# Patient Record
Sex: Male | Born: 1965 | Race: Black or African American | Hispanic: No | Marital: Single | Smoking: Current some day smoker
Health system: Southern US, Community
[De-identification: ages and names within clinical notes are randomized; demographics above are authoritative.]

## PROBLEM LIST (undated history)

## (undated) DIAGNOSIS — I739 Peripheral vascular disease, unspecified: Secondary | ICD-10-CM

## (undated) DIAGNOSIS — I10 Essential (primary) hypertension: Secondary | ICD-10-CM

## (undated) DIAGNOSIS — E781 Pure hyperglyceridemia: Secondary | ICD-10-CM

## (undated) DIAGNOSIS — B182 Chronic viral hepatitis C: Secondary | ICD-10-CM

## (undated) DIAGNOSIS — E782 Mixed hyperlipidemia: Secondary | ICD-10-CM

## (undated) DIAGNOSIS — Z7902 Long term (current) use of antithrombotics/antiplatelets: Secondary | ICD-10-CM

## (undated) DIAGNOSIS — M797 Fibromyalgia: Secondary | ICD-10-CM

## (undated) DIAGNOSIS — K219 Gastro-esophageal reflux disease without esophagitis: Secondary | ICD-10-CM

## (undated) DIAGNOSIS — R079 Chest pain, unspecified: Secondary | ICD-10-CM

## (undated) HISTORY — PX: OTHER SURGICAL HISTORY: SHX169

---

## 2013-07-28 ENCOUNTER — Emergency Department: Payer: Self-pay | Admitting: Emergency Medicine

## 2013-07-28 LAB — BASIC METABOLIC PANEL
Anion Gap: 8 (ref 7–16)
BUN: 14 mg/dL (ref 7–18)
Chloride: 104 mmol/L (ref 98–107)
Co2: 25 mmol/L (ref 21–32)
Creatinine: 1.15 mg/dL (ref 0.60–1.30)
EGFR (African American): >60
EGFR (Non-African Amer.): >60
Glucose: 121 mg/dL — ABNORMAL HIGH (ref 65–99)
Sodium: 137 mmol/L (ref 136–145)

## 2013-07-28 LAB — URINALYSIS, COMPLETE
Bilirubin,UR: NEGATIVE
Blood: NEGATIVE
Glucose,UR: NEGATIVE mg/dL (ref 0–75)
Leukocyte Esterase: NEGATIVE
Ph: 5 (ref 4.5–8.0)
Protein: NEGATIVE
RBC,UR: 3 /HPF (ref 0–5)
Specific Gravity: 1.029 (ref 1.003–1.030)

## 2013-07-28 LAB — CBC
HCT: 43.5 % (ref 40.0–52.0)
HGB: 15.1 g/dL (ref 13.0–18.0)
MCHC: 34.8 g/dL (ref 32.0–36.0)
Platelet: 220 10*3/uL (ref 150–440)
WBC: 8.1 10*3/uL (ref 3.8–10.6)

## 2013-07-28 LAB — TROPONIN I: Troponin-I: 0.03 ng/mL

## 2013-08-01 ENCOUNTER — Emergency Department: Payer: Self-pay | Admitting: Emergency Medicine

## 2013-08-01 LAB — URINALYSIS, COMPLETE
Bilirubin,UR: NEGATIVE
Blood: NEGATIVE
Ketone: NEGATIVE
Ph: 5 (ref 4.5–8.0)
RBC,UR: <1 /HPF (ref 0–5)

## 2013-08-01 LAB — CBC
HCT: 45.6 % (ref 40.0–52.0)
MCHC: 33.6 g/dL (ref 32.0–36.0)
MCV: 88 fL (ref 80–100)
RBC: 5.19 10*6/uL (ref 4.40–5.90)
RDW: 13.6 % (ref 11.5–14.5)

## 2013-08-01 LAB — COMPREHENSIVE METABOLIC PANEL
Alkaline Phosphatase: 86 U/L (ref 50–136)
Anion Gap: 2 — ABNORMAL LOW (ref 7–16)
BUN: 12 mg/dL (ref 7–18)
Bilirubin,Total: 0.9 mg/dL (ref 0.2–1.0)
Calcium, Total: 9.6 mg/dL (ref 8.5–10.1)
Co2: 29 mmol/L (ref 21–32)
Creatinine: 1.03 mg/dL (ref 0.60–1.30)
EGFR (Non-African Amer.): >60
Glucose: 95 mg/dL (ref 65–99)
Osmolality: 272 (ref 275–301)
SGOT(AST): 54 U/L — ABNORMAL HIGH (ref 15–37)
SGPT (ALT): 65 U/L (ref 12–78)
Sodium: 136 mmol/L (ref 136–145)
Total Protein: 8.8 g/dL — ABNORMAL HIGH (ref 6.4–8.2)

## 2013-10-03 DIAGNOSIS — R079 Chest pain, unspecified: Secondary | ICD-10-CM | POA: Insufficient documentation

## 2014-05-31 ENCOUNTER — Emergency Department: Payer: Self-pay | Admitting: Emergency Medicine

## 2014-05-31 LAB — CBC WITH DIFFERENTIAL/PLATELET
Basophil #: 0 10*3/uL (ref 0.0–0.1)
Basophil %: 0.4 %
EOS PCT: 3.5 %
Eosinophil #: 0.2 10*3/uL (ref 0.0–0.7)
HCT: 45.2 % (ref 40.0–52.0)
HGB: 14.6 g/dL (ref 13.0–18.0)
Lymphocyte #: 1.5 10*3/uL (ref 1.0–3.6)
Lymphocyte %: 31.1 %
MCH: 29.2 pg (ref 26.0–34.0)
MCHC: 32.3 g/dL (ref 32.0–36.0)
MCV: 90 fL (ref 80–100)
Monocyte #: 1 x10 3/mm (ref 0.2–1.0)
Monocyte %: 20.8 %
NEUTROS PCT: 44.2 %
Neutrophil #: 2.1 10*3/uL (ref 1.4–6.5)
PLATELETS: 178 10*3/uL (ref 150–440)
RBC: 5 10*6/uL (ref 4.40–5.90)
RDW: 12.9 % (ref 11.5–14.5)
WBC: 4.7 10*3/uL (ref 3.8–10.6)

## 2014-06-17 ENCOUNTER — Ambulatory Visit: Payer: Self-pay | Admitting: Vascular Surgery

## 2014-06-17 LAB — CREATININE, SERUM
CREATININE: 1.01 mg/dL (ref 0.60–1.30)
EGFR (Non-African Amer.): >60

## 2014-06-17 LAB — BUN: BUN: 14 mg/dL (ref 7–18)

## 2014-07-24 ENCOUNTER — Ambulatory Visit: Payer: Self-pay | Admitting: Gastroenterology

## 2014-07-26 LAB — PATHOLOGY REPORT

## 2014-09-10 DIAGNOSIS — G894 Chronic pain syndrome: Secondary | ICD-10-CM | POA: Insufficient documentation

## 2014-09-10 DIAGNOSIS — Z0289 Encounter for other administrative examinations: Secondary | ICD-10-CM | POA: Insufficient documentation

## 2014-09-22 DIAGNOSIS — B182 Chronic viral hepatitis C: Secondary | ICD-10-CM | POA: Insufficient documentation

## 2014-09-23 ENCOUNTER — Ambulatory Visit: Payer: Self-pay | Admitting: Gastroenterology

## 2014-11-18 ENCOUNTER — Ambulatory Visit: Payer: Self-pay | Admitting: Vascular Surgery

## 2014-11-18 LAB — BASIC METABOLIC PANEL
Anion Gap: 7 (ref 7–16)
BUN: 21 mg/dL — ABNORMAL HIGH (ref 7–18)
CHLORIDE: 106 mmol/L (ref 98–107)
CO2: 26 mmol/L (ref 21–32)
Calcium, Total: 8.5 mg/dL (ref 8.5–10.1)
Creatinine: 2.01 mg/dL — ABNORMAL HIGH (ref 0.60–1.30)
EGFR (African American): 46 — ABNORMAL LOW
EGFR (Non-African Amer.): 38 — ABNORMAL LOW
Glucose: 121 mg/dL — ABNORMAL HIGH (ref 65–99)
Osmolality: 282 (ref 275–301)
Potassium: 4.3 mmol/L (ref 3.5–5.1)
SODIUM: 139 mmol/L (ref 136–145)

## 2015-02-14 NOTE — Op Note (Signed)
PATIENT NAME:  Ricky King, Ricky King MR#:  [id number] DATE OF BIRTH:  03/21/1966  DATE OF PROCEDURE:  06/17/2014  PREOPERATIVE DIAGNOSES:  Atherosclerotic occlusive disease, bilateral lower extremities, with rest pain, bilateral lower extremities.   POSTOPERATIVE DIAGNOSES:   1.  Atherosclerotic occlusive disease, bilateral lower extremities, with rest pain, bilateral lower extremities.  2.  Additional complication of vascular device with in stent restenosis.   3.  Hypertension.  4.  Hypercholesterolemia.   PROCEDURES PERFORMED: 1.  Abdominal aortogram.  2.  Right lower extremity distal runoff.  3.  Percutaneous transluminal angioplasty, and stent placement, right common iliac artery to 8 mm.  4.  Percutaneous transluminal angioplasty, and stent placement, left common iliac artery to 8 mm.  5.  Percutaneous transluminal angioplasty, and stent placement, right external iliac artery to 8 mm.  6.  Percutaneous transluminal angioplasty, and stent placement, left external iliac artery to 7 mm.   PROCEDURE PERFORMED:  Ricky Cabal, MD   SEDATION: Versed 5 mg plus fentanyl 200 mcg administered IV. Continuous ECG, pulse oximetry, and cardiopulmonary monitoring was performed throughout the entire procedure by the interventional radiology nurse. Total sedation time was 1 hour 40 minutes.   ACCESS:  1.  A 7 French sheath, right common femoral artery, retrograde direction.  2.  A 7 French sheath, left common femoral artery, retrograde direction.   CONTRAST USED: Isovue 90 mL.   FLUOROSCOPY TIME:  Was 7.6 minutes.   INDICATIONS: Ricky King is a 49 year old gentleman who is complaining of severe pain in both lower extremities. Physical examination as well as noninvasive studies as well as previous interventions, and surgeries confirm severe atherosclerotic occlusive disease. He is therefore undergoing angiography with the hope for intervention for limb salvage. The risks, and benefits have been  reviewed. All questions are answered. The patient has agreed to proceed.   DESCRIPTION OF PROCEDURE: The patient is taken to special procedures, and placed in the supine position. After adequate sedation is achieved, both groins are prepped, and draped in sterile fashion; 1% lidocaine is infiltrated in the soft tissues on the left. Significant scar tissue was noted on the left, status previous femoral to distal bypass surgery; however, with ultrasound guidance given these adverse circumstances, the common femoral artery is identified, it is echolucent, and pulsatile indicating patency. Image is recorded for the permanent record, and after 1% lidocaine is infiltrated in the soft tissues a micropuncture needle is inserted into the anterior wall of microwire followed by micro sheath, J-wire followed by a 5 French sheath. Using a pigtail catheter, and the J-wire, the catheter wire combination is negotiated into the aorta. Pigtail catheter is positioned at the level of T12, and AP projection of the aorta is obtained.   The pigtail catheter is then repositioned to above the bifurcations which show previous stent placement. Interestingly, previous stents have raised the aortic bifurcation by approximately 2.5 to 3 cm. The oblique views of the pelvis both Ricky King, and Ricky King are then obtained. This demonstrates significant disease within both stents, as well as within the proximal external iliac on the right and the proximal,  and mid external iliac on the left.   Ultrasound is then returned to the field. The right common femoral artery is identified. It is echolucent, and pulsatile indicating patency. Image is recorded for the permanent record; 1% lidocaine is infiltrated in the soft tissues, and access to the common femoral artery is obtained with a micropuncture needle, microwire followed by micro sheath, J-wire is then  inserted. J-wire and then a 6 French sheath is inserted. A Rosen wire is then negotiated through the  stents on the right, and a [name] wire is then negotiated through the 5 [place] sheath, and through the stents on the left. A 5 French sheath on the left is upsized to a 6 [place] sheath; subsequently, after magnified views are obtained by hand injection, it is decided to use atrium stents, given the in-stent restenosis in the previously placed stents, and therefore both sheaths subsequently upsized over the [name] wires to 7 [place]. As noted, 4000 units of heparin has been given.   An 8 x 58 atrium stent is advanced up the right, and a 8 x 38, up the left, their positions at the leading edges matched to the previously placed stents, and inflated simultaneously to 14 atmospheres for 1 minute.   Hand injection is then used up the right side and after review of these images, an 8 x 60, LifeStar stent is advanced overlapping the new atrium stent by approximately 8 to 10 mm, and deployed, and subsequently an 8 x 60 Rival balloon is used to post dilate this. This stent extends approximately 2 cm into the external iliac treating the ostial lesion of the external iliac as its intended purpose.   Attention is then returned to the left. Hand injection demonstrates significant stenosis within the external, and distal common; a second 8 x 38 atrium stent is then deployed completing the treatment of the common iliac, and then an 8 x 80 LifeStar stent is deployed overlapping the atrium stent by 8 to 10 mm, and extending it into the mid external iliac on the left, this stent is then post dilated with a 7 x 60 balloon.   The pigtail catheter is then advanced up the right, and AP projection of the aorta iliac system is obtained. Excellent flow is now noted. No significant residual stenosis is noted on either the right or the left, and subsequently hand injection of contrast through the right-sided sheath is used to complete distal runoff of the right leg.   Oblique view of the right groin is obtained, and a StarClose device  deployed without difficulty.   The 7 French sheath is then downsized to a 6 [place] sheath, and subsequently an oblique view is obtained, and a Mynx device is deployed on the left without difficulty. There are no immediate complications.   INTERPRETATION: The abdominal aorta is opacified with a bolus injection of contrast. It is significant for mild atherosclerotic changes to moderate atherosclerotic changes, but no hemodynamically significant stenosis, bilateral nephrograms noted, single renal arteries are noted; moderate renal artery stenosis is identified bilaterally.   At the aortic bifurcation, there is previously placed stents, these extend into the aorta by 2.5 to 3 cm, raising the bifurcation quite a bit, and clearly making it impossible in the future to go up and over. There is also profound subtotal occlusive disease noted in both stents, and this disease in a diffuse nature extends down into the right external, and left external iliac arteries bilaterally. On the left the internal iliac artery is occluded throughout its course on the right, it is a rather large artery.   Later images will demonstrate that the right common femoral, profunda femoris is patent with diffuse disease but no hemodynamically significant lesions. The SFA is patent in its proximal portion for a short segment and then occludes, it reconstitutes at [place]'s canal, and fills a patent popliteal; there is patency of  the trifurcation; however, the posterior tibial appears to be the only tibial which is intact down to the ankle.   On the left as noted above, there is diffuse disease throughout the common and external, and subtotal occlusion throughout the common in several locations.   Following angioplasty and stenting, essentially following the previously placed stents as the guide, using Atrium covered stents, there is excellent result within the commons, using LifeStar stents  in the external portions with excellent result  as well.   SUMMARY: Successful recanalization of the aorta iliac system. Should the patient's symptoms improve sufficiently, and he returns to non-lifestyle limiting claudication then no further interventions; however, as was discussed with him postoperatively, should he continue to have lifestyle limitations, then further treatment of his right superficial femoral artery occlusive lesion can be undertaken, but this will be quite difficult, given the pattern of previously placed stents.    ____________________________ Ricky Cabal, MD ggs:nt D: 06/17/2014 13:43:48 ET T: 06/17/2014 20:40:51 ET JOB#: [id number]  cc: Ricky Cabal, MD, <Dictator> Elyse Jarvis, MD Ricky Cabal MD ELECTRONICALLY SIGNED 07/01/2014 22:38

## 2015-02-22 NOTE — Op Note (Signed)
PATIENT NAME:  Ricky King, Ricky King MR#:  [id number] DATE OF BIRTH:  [birth year]  DATE OF PROCEDURE:  11/18/2014  PREOPERATIVE DIAGNOSIS: Atherosclerotic occlusive disease, bilateral lower extremities, with rest pain of the right lower extremity.  POSTOPERATIVE DIAGNOSIS: Atherosclerotic occlusive disease, bilateral lower extremities, with rest pain of the right lower extremity.  PROCEDURES PERFORMED: 1.  Angiography. right lower extremity, third order catheter placement.  2.  Crosser atherectomy, right superficial femoral artery.  3.  Percutaneous transluminal angioplasty and stent placement to 6 mm, right superficial femoral artery.   SURGEON: [name], MD   SEDATION: Versed plus fentanyl.   CONTRAST USED: Isovue 70 mL.   FLUOROSCOPY TIME: 12.5 minutes.   INDICATIONS: Mr. Battaglini is a 49 year old gentleman who continues to complain of leg pain. He has not gotten any relief from pain management and has decided to proceed with intervention of the right lower extremity. In the past, he has undergone successful angioplasty and stent placement within the aorta iliac system. He has, therefore, elected to move forward with the right lower extremity revascularization. The risks and benefits are reviewed. All questions answered. The patient has agreed to proceed.   DESCRIPTION OF PROCEDURE: The patient is taken to special procedures and placed in the supine position. After adequate sedation has been achieved, he is positioned supine. The right common femoral artery is approached in an antegrade fashion. Ultrasound is placed in a sterile sleeve. The common femoral artery is identified. It is echolucent and pulsatile indicating patency. Image is recorded for the permanent record, and after multiple attempts with several different wires, access is attained. Microwire is advanced under fluoroscopy and actually advances into the SFA de novo. Micro sheath is then placed with its tip in the SFA, J-wire  followed by a 5 [place] sheath.   Hand injection of contrast is then placed through the 5 French sheath, demonstrating that the SFA essentially is a string sign. The sheath is occlusive at the origin. I believe the origin was essentially occluded as well and that the wire and catheter just advanced into it de novo as mentioned above. There is total occlusion in the mid one-third of the SFA. There appears to be reconstitution of the above-knee popliteal. Popliteal in its midportion appears to have some disease and distally the trifurcation is patent and there appears to be 3-vessel runoff to the ankle.   Crosser atherectomy catheter, the S6 device is prepped, a 0.018 wire is advanced through the 5 [place] sheath down to the occlusion and subsequently a straight Usher catheter, S6 and Usher catheter are used to cross the occlusion. Initial image demonstrates the subintimal position; however, on pulling back the Usher and re-imaging there does appear to be a pathway into the native true lumen, which is subsequently negotiated using an angled 0.018 gold-tipped glide catheter. The Usher catheter is then advanced down into the tibioperoneal trunk where hand injection of contrast is used to demonstrate the tibial vessel runoff completely down to the ankle which does then confirm patency. Initially, a 3 x 30 balloon was advanced over the 0.018 wire to pre-dilate this area. Subsequently, a 4 French straight glide catheter is used to exchange the V-18 for a 260 Magic torque, and then using 4 mm Lutonix balloons, serial angioplasty is performed from [place] canal all the way up through the origin. Each inflation is for 3 minutes to 14 atmospheres. Followup imaging demonstrates that there are still multiple areas that are significantly undersized and remain flow limiting and  therefore, a 5 mm balloon angioplasty is performed. This is a standard balloon. Followup imaging demonstrates that in the midportion there continues to be  a greater than 60% residual stenosis, and so a 6 x 120 LifeStent is deployed across this area; it is post dilated with a 6 mm balloon. Followup imaging now demonstrates this area is resolved; however, the sheath appears to be occlusive secondary to high-grade residual stenosis in the origin and a 6 x 10 Lutonix balloon is advanced across the origin of the SFA extending approximately 2 cm into the common femoral. It is used to angioplasty the proximal portion, and followup imaging demonstrates that there is now rapid flow of contrast, wide patency of the origin of the SFA, no evidence of dissection, stent is now widely with patent and well approximated. In the mid popliteal there appears to be a greater than 70% stenosis and this is treated with a 4 mm balloon angioplasty using one of the previously inflated Lutonix. Inflation is to 8 atmospheres for 1 minute and followup  imaging from [facility] canal down distally demonstrates wide patency of the popliteal, the trifurcation, and tibial vessels.   Wire is then negotiated into the profunda. The wire in the SFA is removed. Dilator is advanced over the wire through the sheath and the sheath is advanced into the profunda. A Mynx device is then deployed with excellent result. There are no immediate complications. The patient is taken to recovery in stable condition.   INTERPRETATION: Initial views demonstrate the common femoral is patent as is the profunda. There is poor collateral filling between the profunda and the SFA. The SFA is a string sign, essentially occluded at its origin, and true absence of any evidence of the SFA in its mid one-third as noted. The distal SFA above-knee popliteal is reconstituted and there appears to be patency down to the foot as described above.   Following crosser atherectomy, the true lumen is engaged and this is verified with contrast injection. Following balloon angioplasty, there is high-grade residual stenosis in the midportion.  This is resolved with stent placement at the origin. This stenosis is resolved with angioplasty alone, but it was taken to 6 mm in diameter using a Lutonix balloon.   SUMMARY: Successful recanalization of the right SFA from the right groin, antegrade approach, as described above.    ____________________________ [name], MD ggs:TT D: 11/18/2014 11:45:19 ET T: 11/18/2014 14:20:26 ET JOB#: [id number]  cc: [name], MD, <Dictator> [name] MD ELECTRONICALLY SIGNED 11/26/2014 17:43

## 2015-05-24 DIAGNOSIS — G5790 Unspecified mononeuropathy of unspecified lower limb: Secondary | ICD-10-CM | POA: Insufficient documentation

## 2015-08-13 ENCOUNTER — Ambulatory Visit: Payer: Medicaid Other | Attending: Family Medicine

## 2015-08-18 ENCOUNTER — Ambulatory Visit: Payer: Medicaid Other

## 2015-09-16 ENCOUNTER — Encounter: Payer: Self-pay | Admitting: *Deleted

## 2015-09-21 ENCOUNTER — Ambulatory Visit: Payer: Medicaid Other | Admitting: Registered Nurse

## 2015-09-21 ENCOUNTER — Encounter: Payer: Self-pay | Admitting: *Deleted

## 2015-09-21 ENCOUNTER — Encounter
Admission: RE | Disposition: A | Discharge status: Home / Self Care | Payer: Self-pay | Source: Ambulatory Visit | Attending: Gastroenterology

## 2015-09-21 ENCOUNTER — Ambulatory Visit
Admission: RE | Admit: 2015-09-21 | Discharge: 2015-09-21 | Disposition: A | Discharge status: Home / Self Care | Payer: Medicaid Other | Source: Ambulatory Visit | Attending: Gastroenterology | Admitting: Gastroenterology

## 2015-09-21 DIAGNOSIS — E782 Mixed hyperlipidemia: Secondary | ICD-10-CM | POA: Diagnosis not present

## 2015-09-21 DIAGNOSIS — E781 Pure hyperglyceridemia: Secondary | ICD-10-CM | POA: Diagnosis not present

## 2015-09-21 DIAGNOSIS — Z79899 Other long term (current) drug therapy: Secondary | ICD-10-CM | POA: Insufficient documentation

## 2015-09-21 DIAGNOSIS — I739 Peripheral vascular disease, unspecified: Secondary | ICD-10-CM | POA: Insufficient documentation

## 2015-09-21 DIAGNOSIS — R079 Chest pain, unspecified: Secondary | ICD-10-CM | POA: Diagnosis not present

## 2015-09-21 DIAGNOSIS — I10 Essential (primary) hypertension: Secondary | ICD-10-CM | POA: Insufficient documentation

## 2015-09-21 DIAGNOSIS — Z7982 Long term (current) use of aspirin: Secondary | ICD-10-CM | POA: Insufficient documentation

## 2015-09-21 DIAGNOSIS — R1310 Dysphagia, unspecified: Secondary | ICD-10-CM | POA: Diagnosis present

## 2015-09-21 DIAGNOSIS — K921 Melena: Secondary | ICD-10-CM | POA: Diagnosis not present

## 2015-09-21 DIAGNOSIS — K219 Gastro-esophageal reflux disease without esophagitis: Secondary | ICD-10-CM | POA: Diagnosis not present

## 2015-09-21 DIAGNOSIS — K738 Other chronic hepatitis, not elsewhere classified: Secondary | ICD-10-CM | POA: Diagnosis not present

## 2015-09-21 DIAGNOSIS — Z87891 Personal history of nicotine dependence: Secondary | ICD-10-CM | POA: Insufficient documentation

## 2015-09-21 DIAGNOSIS — K31819 Angiodysplasia of stomach and duodenum without bleeding: Secondary | ICD-10-CM | POA: Diagnosis not present

## 2015-09-21 DIAGNOSIS — M797 Fibromyalgia: Secondary | ICD-10-CM | POA: Diagnosis not present

## 2015-09-21 HISTORY — DX: Gastro-esophageal reflux disease without esophagitis: K21.9

## 2015-09-21 HISTORY — DX: Long term (current) use of antithrombotics/antiplatelets: Z79.02

## 2015-09-21 HISTORY — DX: Fibromyalgia: M79.7

## 2015-09-21 HISTORY — PX: ESOPHAGOGASTRODUODENOSCOPY (EGD) WITH PROPOFOL: SHX5813

## 2015-09-21 HISTORY — DX: Chronic viral hepatitis C: B18.2

## 2015-09-21 HISTORY — DX: Mixed hyperlipidemia: E78.2

## 2015-09-21 HISTORY — DX: Peripheral vascular disease, unspecified: I73.9

## 2015-09-21 HISTORY — DX: Essential (primary) hypertension: I10

## 2015-09-21 HISTORY — DX: Chest pain, unspecified: R07.9

## 2015-09-21 HISTORY — DX: Pure hyperglyceridemia: E78.1

## 2015-09-21 SURGERY — ESOPHAGOGASTRODUODENOSCOPY (EGD) WITH PROPOFOL
Anesthesia: General

## 2015-09-21 MED ORDER — GLYCOPYRROLATE 0.2 MG/ML IJ SOLN
INTRAMUSCULAR | Status: DC | PRN
  Administered 2015-09-21: 0.2 mg via INTRAVENOUS

## 2015-09-21 MED ORDER — PROPOFOL 10 MG/ML IV BOLUS
INTRAVENOUS | Status: DC | PRN
  Administered 2015-09-21 (×2): 20 mg via INTRAVENOUS
  Administered 2015-09-21: 50 mg via INTRAVENOUS

## 2015-09-21 MED ORDER — SODIUM CHLORIDE 0.9 % IV SOLN
INTRAVENOUS | Status: DC
  Administered 2015-09-21: 10:00:00 via INTRAVENOUS

## 2015-09-21 MED ORDER — MIDAZOLAM HCL 2 MG/2ML IJ SOLN
INTRAMUSCULAR | Status: DC | PRN
  Administered 2015-09-21: 1 mg via INTRAVENOUS

## 2015-09-21 MED ORDER — FENTANYL CITRATE (PF) 100 MCG/2ML IJ SOLN
INTRAMUSCULAR | Status: DC | PRN
  Administered 2015-09-21: 50 ug via INTRAVENOUS

## 2015-09-21 MED ORDER — LIDOCAINE HCL (CARDIAC) 20 MG/ML IV SOLN
INTRAVENOUS | Status: DC | PRN
  Administered 2015-09-21: 40 mg via INTRAVENOUS

## 2015-09-21 MED ORDER — PROPOFOL 500 MG/50ML IV EMUL
INTRAVENOUS | Status: DC | PRN
  Administered 2015-09-21: 200 ug/kg/min via INTRAVENOUS

## 2015-09-21 NOTE — Op Note (Signed)
[facility] Patient Name: Ricky King Procedure Date: 09/21/2015 10:50 AM MRN: [id number] Account #: [id number] Date of Birth: 1966-10-19 Admit Type: Outpatient Age: 49 Room: [facility] ENDO ROOM 2 Gender: Male Note Status: Finalized Procedure:         Upper GI endoscopy Indications:       Dysphagia, Melena Patient Profile:   This is a 49 year old male. Providers:         Gerrit Heck. Rayann Heman, MD Referring MD:      Dyke Maes. Mancheno Revelo (Referring MD) Medicines:         Propofol per Anesthesia Complications:     No immediate complications. Procedure:         Pre-Anesthesia Assessment:                    - Prior to the procedure, a History and Physical was                     performed, and patient medications, allergies and                     sensitivities were reviewed. The patient's tolerance of                     previous anesthesia was reviewed.                    After obtaining informed consent, the endoscope was passed                     under direct vision. Throughout the procedure, the                     patient's blood pressure, pulse, and oxygen saturations                     were monitored continuously. The Olympus GIF-160 endoscope                     (S#. [id number]) was introduced through the mouth, and                     advanced to the second part of duodenum. The upper GI                     endoscopy was accomplished without difficulty. The patient                     tolerated the procedure well. Findings:      The esophagus was normal.      A single small no bleeding angioectasia was found in the gastric body.       Fulguration to ablate the lesion by argon plasma at 0.8 liters/minute       and 30 watts was successful.      The examined duodenum was normal.      Biopsies were taken with a cold forceps in the lower third of the       esophagus for histology.      Biopsies were taken with a cold forceps in the upper third of  the       esophagus for histology. Impression:        - Normal esophagus.                    -  A single non-bleeding angioectasia in the stomach.                     Treated with argon plasma coagulation (APC).                    - Normal examined duodenum.                    - Biopsies were taken with a cold forceps for histology in                     the lower third of the esophagus.                    - Biopsies were taken with a cold forceps for histology in                     the upper third of the esophagus. Recommendation:    - Observe patient in GI recovery unit.                    - Resume regular diet.                    - Continue present medications.                    - Await pathology results.                    - Return to GI office.                    - Consider esophageal manometry if trouble swallowing                     continues and biopsies are normal                    - Perform colonoscopy if black stools continue                    - Ok to restart plavix today.                    - The findings and recommendations were discussed with the                     patient.                    - The findings and recommendations were discussed with the                     patient's family. Procedure Code(s): --- Professional ---                    [phone], Esophagogastroduodenoscopy, flexible, transoral;                     with ablation of tumor(s), polyp(s), or other lesion(s)                     (includes pre- and post-dilation and guide wire passage,                     when performed)                    [id number], Esophagogastroduodenoscopy,  flexible, transoral;                     with biopsy, single or multiple Diagnosis Code(s): --- Professional ---                    K31.819, Angiodysplasia of stomach and duodenum without                     bleeding                    R13.10, Dysphagia, unspecified                    K92.1, Melena CPT copyright 2014 American  Medical Association. All rights reserved. The codes documented in this report are preliminary and upon coder review may  be revised to meet current compliance requirements. Mellody Life, MD 09/21/2015 11:14:22 AM This report has been signed electronically. Number of Addenda: 0 Note Initiated On: 09/21/2015 10:50 AM      [facility]

## 2015-09-21 NOTE — Transfer of Care (Signed)
Immediate Anesthesia Transfer of Care Note  Patient: Ricky King  Procedure(s) Performed: Procedure(s): ESOPHAGOGASTRODUODENOSCOPY (EGD) WITH PROPOFOL (N/A)  Patient Location: PACU and Endoscopy Unit  Anesthesia Type:General  Level of Consciousness: sedated  Airway & Oxygen Therapy: Patient Spontanous Breathing and Patient connected to nasal cannula oxygen  Post-op Assessment: Report given to RN and Post -op Vital signs reviewed and stable  Post vital signs: Reviewed and stable  Last Vitals:  Filed Vitals:   09/21/15 0944 09/21/15 1116  BP: 132/83 100/56  Pulse: 79 92  Temp: 36.4 C 36.6 C  Resp: 12 17    Complications: No apparent anesthesia complications

## 2015-09-21 NOTE — Anesthesia Postprocedure Evaluation (Signed)
Anesthesia Post Note  Patient: Ricky King  Procedure(s) Performed: Procedure(s) (LRB): ESOPHAGOGASTRODUODENOSCOPY (EGD) WITH PROPOFOL (N/A)  Patient location during evaluation: PACU Anesthesia Type: General Level of consciousness: awake Pain management: pain level controlled Vital Signs Assessment: post-procedure vital signs reviewed and stable Respiratory status: nonlabored ventilation Cardiovascular status: stable Anesthetic complications: no    Last Vitals:  Filed Vitals:   09/21/15 0944 09/21/15 1116  BP: 132/83 100/56  Pulse: 79 92  Temp: 36.4 C 36.6 C  Resp: 12 17    Last Pain:  Filed Vitals:   09/21/15 1117  PainSc: 8                  VAN STAVEREN,Orrie Lascano

## 2015-09-21 NOTE — Discharge Instructions (Signed)
OK to restart plavix today.   YOU HAD AN ENDOSCOPIC PROCEDURE TODAY: Refer to the procedure report that was given to you for any specific questions about what was found during the examination.  If the procedure report does not answer your questions, please call your gastroenterologist to clarify.  YOU SHOULD EXPECT: Some feelings of bloating in the abdomen. Passage of more gas than usual.  Walking can help get rid of the air that was put into your GI tract during the procedure and reduce the bloating. If you had a lower endoscopy (such as a colonoscopy or flexible sigmoidoscopy) you may notice spotting of blood in your stool or on the toilet paper.   DIET: Your first meal following the procedure should be a light meal and then it is ok to progress to your normal diet.  A half-sandwich or bowl of soup is an example of a good first meal.  Heavy or fried foods are harder to digest and may make you feel nasueas or bloated.  Drink plenty of fluids but you should avoid alcoholic beverages for 24 hours.  ACTIVITY: Your care partner should take you home directly after the procedure.  You should plan to take it easy, moving slowly for the rest of the day.  You can resume normal activity the day after the procedure however you should NOT DRIVE or use heavy machinery for 24 hours (because of the sedation medicines used during the test).    SYMPTOMS TO REPORT IMMEDIATELY  A gastroenterologist can be reached at any hour.  Please call your doctor's office for any of the following symptoms:   Following lower endoscopy (colonoscopy, flexible sigmoidoscopy)  Excessive amounts of blood in the stool  Significant tenderness, worsening of abdominal pains  Swelling of the abdomen that is new, acute  Fever of 100 or higher  Following upper endoscopy (EGD, EUS, ERCP)  Vomiting of blood or coffee ground material  New, significant abdominal pain  New, significant chest pain or pain under the shoulder blades  Painful  or persistently difficult swallowing  New shortness of breath  Black, tarry-looking stools  FOLLOW UP: If any biopsies were taken you will be contacted by phone or by letter within the next 1-3 weeks.  Call your gastroenterologist if you have not heard about the biopsies in 3 weeks.  Please also call your gastroenterologist's office with any specific questions about appointments or follow up tests.

## 2015-09-21 NOTE — Anesthesia Procedure Notes (Signed)
Date/Time: 09/21/2015 10:54 AM Performed by: Doreen Salvage Pre-anesthesia Checklist: Patient identified, Emergency Drugs available, Suction available and Patient being monitored Patient Re-evaluated:Patient Re-evaluated prior to inductionOxygen Delivery Method: Nasal cannula Intubation Type: IV induction Dental Injury: Teeth and Oropharynx as per pre-operative assessment  Comments: Nasal cannula with etCO2 monitoring

## 2015-09-21 NOTE — Anesthesia Preprocedure Evaluation (Signed)
Anesthesia Evaluation  Patient identified by MRN, date of birth, ID band Patient awake    Reviewed: Allergy & Precautions, NPO status , Patient's Chart, lab work & pertinent test results, reviewed documented beta blocker date and time   Airway Mallampati: II       Dental  (+) Teeth Intact   Pulmonary COPD, former smoker,     + decreased breath sounds      Cardiovascular hypertension, Pt. on home beta blockers + Peripheral Vascular Disease   Rhythm:Regular Rate:Normal     Neuro/Psych    GI/Hepatic GERD  ,  Endo/Other  negative endocrine ROS  Renal/GU      Musculoskeletal   Abdominal Normal abdominal exam  (+)   Peds  Hematology negative hematology ROS (+)   Anesthesia Other Findings   Reproductive/Obstetrics                             Anesthesia Physical Anesthesia Plan  ASA: III  Anesthesia Plan: General   Post-op Pain Management:    Induction: Intravenous  Airway Management Planned: Nasal Cannula  Additional Equipment:   Intra-op Plan:   Post-operative Plan:   Informed Consent: I have reviewed the patients History and Physical, chart, labs and discussed the procedure including the risks, benefits and alternatives for the proposed anesthesia with the patient or authorized representative who has indicated his/her understanding and acceptance.     Plan Discussed with: CRNA  Anesthesia Plan Comments:         Anesthesia Quick Evaluation

## 2015-09-21 NOTE — H&P (Signed)
  Primary Care Physician:  Theotis Burrow, MD  Pre-Procedure History & Physical: HPI:  Ricky King is a 49 y.o. male is here for an endoscopy.   Past Medical History  Diagnosis Date  . Benign essential hypertension   . Hyperlipidemia, mixed   . Chest pain   . Chronic hepatitis C without hepatic coma ([place])   . Hypertriglyceridemia   . GERD (gastroesophageal reflux disease)   . Fibromyalgia   . Platelet inhibition due to Plavix   . PVD (peripheral vascular disease) ([place])     History reviewed. No pertinent past surgical history.  Prior to Admission medications   Medication Sig Start Date End Date Taking? Authorizing Provider  aspirin 81 MG chewable tablet Chew by mouth daily.   Yes Historical Provider, MD  atorvastatin (LIPITOR) 40 MG tablet Take 40 mg by mouth daily.   Yes Historical Provider, MD  clopidogrel (PLAVIX) 75 MG tablet Take 75 mg by mouth daily.   Yes Historical Provider, MD  diclofenac sodium (VOLTAREN) 1 % GEL Apply topically 4 (four) times daily.   Yes Historical Provider, MD  lisinopril (PRINIVIL,ZESTRIL) 20 MG tablet Take 20 mg by mouth daily.   Yes Historical Provider, MD  metoprolol succinate (TOPROL-XL) 25 MG 24 hr tablet Take 25 mg by mouth daily.   Yes Historical Provider, MD  pregabalin (LYRICA) 150 MG capsule Take 150 mg by mouth 2 (two) times daily.   Yes Historical Provider, MD  gemfibrozil (LOPID) 600 MG tablet Take 600 mg by mouth 2 (two) times daily before a meal.    Historical Provider, MD  nortriptyline (PAMELOR) 25 MG capsule Take 25 mg by mouth at bedtime.    Historical Provider, MD  topiramate (TOPAMAX SPRINKLE) 25 MG capsule Take 25 mg by mouth 2 (two) times daily.    Historical Provider, MD    Allergies as of 08/24/2015  . (Not on File)    History reviewed. No pertinent family history.  Social History   Social History  . Marital Status: Single    Spouse Name: N/A  . Number of Children: N/A  . Years of Education: N/A    Occupational History  . Not on file.   Social History Main Topics  . Smoking status: Former Research scientist (life sciences)  . Smokeless tobacco: Never Used  . Alcohol Use: No  . Drug Use: No  . Sexual Activity: Not on file   Other Topics Concern  . Not on file   Social History Narrative     Physical Exam: BP 132/83 mmHg  Pulse 79  Temp(Src) 97.6 F (36.4 C) (Tympanic)  Resp 12  Ht 5\' 7"  (1.702 m)  Wt 87.998 kg (194 lb)  BMI 30.38 kg/m2  SpO2 95% General:   Alert,  pleasant and cooperative in NAD Head:  Normocephalic and atraumatic. Neck:  Supple; no masses or thyromegaly. Lungs:  Clear throughout to auscultation.    Heart:  Regular rate and rhythm. Abdomen:  Soft, nontender and nondistended. Normal bowel sounds, without guarding, and without rebound.   Neurologic:  Alert and  oriented x4;  grossly normal neurologically.  Impression/Plan: Ricky King is here for an endoscopy to be performed for dysphagia, melena  Risks, benefits, limitations, and alternatives regarding  endoscopy have been reviewed with the patient.  Questions have been answered.  All parties agreeable.   Josefine Class, MD  09/21/2015, 10:49 AM

## 2015-09-22 LAB — SURGICAL PATHOLOGY

## 2015-09-23 ENCOUNTER — Encounter: Payer: Self-pay | Admitting: Gastroenterology

## 2015-10-24 ENCOUNTER — Encounter: Payer: Self-pay | Admitting: Emergency Medicine

## 2015-10-24 ENCOUNTER — Emergency Department
Admission: EM | Admit: 2015-10-24 | Discharge: 2015-10-24 | Disposition: A | Discharge status: Home / Self Care | Payer: No Typology Code available for payment source | Attending: Emergency Medicine | Admitting: Emergency Medicine

## 2015-10-24 DIAGNOSIS — Y9389 Activity, other specified: Secondary | ICD-10-CM | POA: Insufficient documentation

## 2015-10-24 DIAGNOSIS — Z7982 Long term (current) use of aspirin: Secondary | ICD-10-CM | POA: Diagnosis not present

## 2015-10-24 DIAGNOSIS — Z791 Long term (current) use of non-steroidal anti-inflammatories (NSAID): Secondary | ICD-10-CM | POA: Diagnosis not present

## 2015-10-24 DIAGNOSIS — Z7902 Long term (current) use of antithrombotics/antiplatelets: Secondary | ICD-10-CM | POA: Diagnosis not present

## 2015-10-24 DIAGNOSIS — S0990XA Unspecified injury of head, initial encounter: Secondary | ICD-10-CM | POA: Insufficient documentation

## 2015-10-24 DIAGNOSIS — S199XXA Unspecified injury of neck, initial encounter: Secondary | ICD-10-CM | POA: Diagnosis not present

## 2015-10-24 DIAGNOSIS — M62830 Muscle spasm of back: Secondary | ICD-10-CM | POA: Diagnosis not present

## 2015-10-24 DIAGNOSIS — I739 Peripheral vascular disease, unspecified: Secondary | ICD-10-CM | POA: Diagnosis not present

## 2015-10-24 DIAGNOSIS — E785 Hyperlipidemia, unspecified: Secondary | ICD-10-CM | POA: Diagnosis not present

## 2015-10-24 DIAGNOSIS — Y9289 Other specified places as the place of occurrence of the external cause: Secondary | ICD-10-CM | POA: Insufficient documentation

## 2015-10-24 DIAGNOSIS — I10 Essential (primary) hypertension: Secondary | ICD-10-CM | POA: Insufficient documentation

## 2015-10-24 DIAGNOSIS — Y998 Other external cause status: Secondary | ICD-10-CM | POA: Insufficient documentation

## 2015-10-24 DIAGNOSIS — B1920 Unspecified viral hepatitis C without hepatic coma: Secondary | ICD-10-CM | POA: Insufficient documentation

## 2015-10-24 DIAGNOSIS — Z87891 Personal history of nicotine dependence: Secondary | ICD-10-CM | POA: Diagnosis not present

## 2015-10-24 DIAGNOSIS — S299XXA Unspecified injury of thorax, initial encounter: Secondary | ICD-10-CM | POA: Insufficient documentation

## 2015-10-24 DIAGNOSIS — Z79899 Other long term (current) drug therapy: Secondary | ICD-10-CM | POA: Diagnosis not present

## 2015-10-24 MED ORDER — METHOCARBAMOL 750 MG PO TABS: 1500.0000 mg | ORAL_TABLET | Freq: Four times a day (QID) | ORAL | Status: DC

## 2015-10-24 MED ORDER — TRAMADOL HCL 50 MG PO TABS: 50.0000 mg | ORAL_TABLET | Freq: Four times a day (QID) | ORAL | Status: DC | PRN

## 2015-10-24 NOTE — Discharge Instructions (Signed)
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
## 2015-10-24 NOTE — ED Notes (Signed)
Discussed discharge instructions, prescriptions, and follow-up care with patient. No questions or concerns at this time. Pt stable at discharge.  

## 2016-04-12 ENCOUNTER — Ambulatory Visit: Payer: Medicaid Other | Admitting: Physical Therapy

## 2016-04-18 ENCOUNTER — Ambulatory Visit: Payer: Medicaid Other | Attending: Family Medicine | Admitting: Physical Therapy

## 2016-04-18 DIAGNOSIS — M545 Low back pain, unspecified: Secondary | ICD-10-CM

## 2016-04-18 NOTE — Therapy (Signed)
[facility] [address] [place], [address] Phone: [phone]   Fax:  [phone]  Physical Therapy Evaluation  Patient Details  Name: Ricky King MRN: [id number] Date of Birth: [birth year] Referring Provider: headrick  Encounter Date: 04/18/2016      PT End of Session - 04/18/16 1633    Visit Number 1   Number of Visits 1   PT Start Time 0345   PT Stop Time 0430   PT Time Calculation (min) 45 min   Activity Tolerance Patient tolerated treatment well      Past Medical History  Diagnosis Date  . Benign essential hypertension   . Hyperlipidemia, mixed   . Chest pain   . Chronic hepatitis C without hepatic coma ([place])   . Hypertriglyceridemia   . GERD (gastroesophageal reflux disease)   . Fibromyalgia   . Platelet inhibition due to Plavix   . PVD (peripheral vascular disease) [facility])     Past Surgical History  Procedure Laterality Date  . Esophagogastroduodenoscopy (egd) with propofol N/A 09/21/2015    Procedure: ESOPHAGOGASTRODUODENOSCOPY (EGD) WITH PROPOFOL;  Surgeon: Josefine Class, MD;  Location: [facility] ENDOSCOPY;  Service: Endoscopy;  Laterality: N/A;    There were no vitals filed for this visit.       Subjective Assessment - 04/18/16 1546    Subjective Pt reports 2 yr history of back pain. He believes this began when he had some clogged arteries in his legs and he was unable to bend his knees. This required him to modify his gait pattern which brought on pain. Pain has been improving since that time. "Its still the same pain but it takes longer before I hurt".   Limitations Standing;Walking   How long can you stand comfortably? 10 min   How long can you walk comfortably? "long periods of time".   Diagnostic tests x-ray - pt does not know results. Not done at [facility] facility   Patient Stated Goals "I don't know"   Currently in Pain? Yes   Pain Score 6    Pain Location Back             [facility] PT Assessment - 04/18/16 0001    Assessment   Medical Diagnosis low back pain   Referring Provider headrick   Prior Therapy yes   Precautions   Precautions None   Restrictions   Weight Bearing Restrictions No   Balance Screen   Has the patient fallen in the past 6 months No   Has the patient had a decrease in activity level because of a fear of falling?  No   Is the patient reluctant to leave their home because of a fear of falling?  No   Prior Function   Level of Independence Independent with basic ADLs   Vocation On disability   Leisure walking, "puttering"    ROM / Strength   AROM / PROM / Strength AROM;Strength   AROM   Overall AROM Comments lumbar flexion to hands above knees and painful. lumbar extension is painful and limited. rotation is painful.   Strength   Overall Strength Comments hip flexion 3+/5, hip abduction 3/5, demonstrates inability to maintain TA contraction.   Palpation   Palpation comment deferred due to inability to address due to insurance limitations.   Transfers   Comments sit<>stand with definite UE spport needed.   Ambulation/Gait   Gait Comments gait is painful, with incr. lumbar lordosis, decr. knee flexion,  lateral trunk sway side to side with no horizontal plane movement.           Objective:  Lumbar flexion/extension 2x10 - cuing for maintaining straight legs, pushing into stretch while avoiding pain  Lumbar rotation with deep breathing 2x10 required mirroring from PT for breathing pattern.  Following this pt reported decr. Pain to 6/10.                 PT Education - 04/18/16 1633    Education provided Yes   Education Details educated pt on stretches for lumbar spine, [facility]   Person(s) Educated Patient   Methods Explanation   Comprehension Verbalized understanding             PT Long Term Goals - 04/18/16 1637    PT LONG TERM GOAL #1   Title Pt will participate with PT  evaluation and verbalize understanding of HEP to minimize back pain.   Baseline pt requires cuing for HEP   Time 1   Period Days   Status New               Plan - 04/18/16 1634    Clinical Impression Statement Pt is a pleasant 50 y/o male with c/o 2 yr h/o back pain. pain began following period of sedentary lifestyle due to PVD. Based on assessment pt appears to have back pain primarily due to stiffness and hypomobility in spine, this would improve potentially with PT however pt's insurance does not allow for additional sessions. PT encouraged pt to reach out to [facility] for followup [facility] and to reach out to PT in several weeks if he has been doing his exercises for continued advice on ther ex. Pt currently presents with pain, hypomobility, muscle weakness and impaired functional tolerance, would benefit from skilled PT to address these issues.   Rehab Potential Fair   Clinical Impairments Affecting Rehab Potential inability to come for additional sessions, chronic pain   PT Frequency 1x / week   PT Duration 2 weeks   Consulted and Agree with Plan of Care Patient      Patient will benefit from skilled therapeutic intervention in order to improve the following deficits and impairments:  Pain, Improper body mechanics, Difficulty walking, Decreased strength  Visit Diagnosis: Midline low back pain without sciatica - Plan: PT plan of care cert/re-cert     Problem List There are no active problems to display for this patient.   Fisher,Benjamin PT DPT 04/18/2016, 4:40 PM  [facility] [address] [place], [address] Phone: [phone]   Fax:  [phone]  Name: CASYN TORRENCE MRN: [id number] Date of Birth: 1966-09-03

## 2017-07-12 ENCOUNTER — Encounter: Payer: Self-pay | Admitting: *Deleted

## 2017-07-12 ENCOUNTER — Emergency Department: Payer: Medicaid Other

## 2017-07-12 ENCOUNTER — Emergency Department
Admission: EM | Admit: 2017-07-12 | Discharge: 2017-07-12 | Disposition: A | Discharge status: Home / Self Care | Payer: Medicaid Other | Attending: Emergency Medicine | Admitting: Emergency Medicine

## 2017-07-12 DIAGNOSIS — Z791 Long term (current) use of non-steroidal anti-inflammatories (NSAID): Secondary | ICD-10-CM | POA: Diagnosis not present

## 2017-07-12 DIAGNOSIS — R112 Nausea with vomiting, unspecified: Secondary | ICD-10-CM | POA: Diagnosis not present

## 2017-07-12 DIAGNOSIS — Z7982 Long term (current) use of aspirin: Secondary | ICD-10-CM | POA: Insufficient documentation

## 2017-07-12 DIAGNOSIS — Z7902 Long term (current) use of antithrombotics/antiplatelets: Secondary | ICD-10-CM | POA: Diagnosis not present

## 2017-07-12 DIAGNOSIS — I10 Essential (primary) hypertension: Secondary | ICD-10-CM | POA: Diagnosis not present

## 2017-07-12 DIAGNOSIS — K529 Noninfective gastroenteritis and colitis, unspecified: Secondary | ICD-10-CM | POA: Diagnosis not present

## 2017-07-12 DIAGNOSIS — R109 Unspecified abdominal pain: Secondary | ICD-10-CM | POA: Diagnosis present

## 2017-07-12 DIAGNOSIS — Z79899 Other long term (current) drug therapy: Secondary | ICD-10-CM | POA: Insufficient documentation

## 2017-07-12 DIAGNOSIS — R197 Diarrhea, unspecified: Secondary | ICD-10-CM

## 2017-07-12 DIAGNOSIS — F17210 Nicotine dependence, cigarettes, uncomplicated: Secondary | ICD-10-CM | POA: Diagnosis not present

## 2017-07-12 LAB — CBC
HEMATOCRIT: 43.6 % (ref 40.0–52.0)
HEMOGLOBIN: 14.7 g/dL (ref 13.0–18.0)
MCH: 29.5 pg (ref 26.0–34.0)
MCHC: 33.7 g/dL (ref 32.0–36.0)
MCV: 87.5 fL (ref 80.0–100.0)
Platelets: 201 10*3/uL (ref 150–440)
RBC: 4.99 MIL/uL (ref 4.40–5.90)
RDW: 13.4 % (ref 11.5–14.5)
WBC: 4.1 10*3/uL (ref 3.8–10.6)

## 2017-07-12 LAB — COMPREHENSIVE METABOLIC PANEL
ALBUMIN: 4 g/dL (ref 3.5–5.0)
ALT: 39 U/L (ref 17–63)
ANION GAP: 10 (ref 5–15)
AST: 47 U/L — AB (ref 15–41)
Alkaline Phosphatase: 51 U/L (ref 38–126)
BUN: 16 mg/dL (ref 6–20)
CHLORIDE: 99 mmol/L — AB (ref 101–111)
CO2: 23 mmol/L (ref 22–32)
Calcium: 9.5 mg/dL (ref 8.9–10.3)
Creatinine, Ser: 2.06 mg/dL — ABNORMAL HIGH (ref 0.61–1.24)
GFR calc Af Amer: 41 mL/min — ABNORMAL LOW (ref >60)
GFR, EST NON AFRICAN AMERICAN: 35 mL/min — AB (ref >60)
Glucose, Bld: 121 mg/dL — ABNORMAL HIGH (ref 65–99)
POTASSIUM: 3.9 mmol/L (ref 3.5–5.1)
Sodium: 132 mmol/L — ABNORMAL LOW (ref 135–145)
TOTAL PROTEIN: 8 g/dL (ref 6.5–8.1)
Total Bilirubin: 1.9 mg/dL — ABNORMAL HIGH (ref 0.3–1.2)

## 2017-07-12 LAB — URINALYSIS, COMPLETE (UACMP) WITH MICROSCOPIC
BACTERIA UA: NONE SEEN
BILIRUBIN URINE: NEGATIVE
GLUCOSE, UA: NEGATIVE mg/dL
KETONES UR: NEGATIVE mg/dL
LEUKOCYTES UA: NEGATIVE
NITRITE: NEGATIVE
PROTEIN: 100 mg/dL — AB
Specific Gravity, Urine: 1.017 (ref 1.005–1.030)
pH: 5 (ref 5.0–8.0)

## 2017-07-12 LAB — LIPASE, BLOOD: LIPASE: 39 U/L (ref 11–51)

## 2017-07-12 LAB — TROPONIN I

## 2017-07-12 MED ORDER — ONDANSETRON HCL 4 MG/2ML IJ SOLN
4.0000 mg | Freq: Once | INTRAMUSCULAR | Status: AC
  Administered 2017-07-12: 4 mg via INTRAVENOUS
  Filled 2017-07-12: qty 2

## 2017-07-12 MED ORDER — ALUM & MAG HYDROXIDE-SIMETH 400-400-40 MG/5ML PO SUSP: 5.0000 mL | Freq: Four times a day (QID) | ORAL | 0 refills | Status: DC | PRN

## 2017-07-12 MED ORDER — SODIUM CHLORIDE 0.9 % IV BOLUS (SEPSIS)
1000.0000 mL | Freq: Once | INTRAVENOUS | Status: AC
  Administered 2017-07-12: 1000 mL via INTRAVENOUS

## 2017-07-12 MED ORDER — DICYCLOMINE HCL 10 MG PO CAPS: 10.0000 mg | ORAL_CAPSULE | Freq: Four times a day (QID) | ORAL | 0 refills | Status: DC

## 2017-07-12 MED ORDER — ONDANSETRON 4 MG PO TBDP: 4.0000 mg | ORAL_TABLET | Freq: Three times a day (TID) | ORAL | 0 refills | Status: DC | PRN

## 2017-07-12 MED ORDER — MORPHINE SULFATE (PF) 4 MG/ML IV SOLN
4.0000 mg | Freq: Once | INTRAVENOUS | Status: AC
  Administered 2017-07-12: 4 mg via INTRAVENOUS
  Filled 2017-07-12: qty 1

## 2017-07-12 NOTE — ED Triage Notes (Signed)
PT to ED reporting generalized abd pain x 2 days. Pt reports vomiting and diarrhea. No fevers. No changes reported in urine but pt also verbalized intermittent bilateral flank pain for the past 2 days as well.

## 2017-07-12 NOTE — ED Notes (Signed)
Pt states abd pain "all over". States he has had this going on x 4-5 years but it won't go away these last few days. States diarrhea and vomiting as well. States has had 2 episodes of diarrhea since being in ED. Denies blood in either. Alert, oriented, in obvious pain. Also states pain on both flanks.

## 2017-07-12 NOTE — Discharge Instructions (Signed)
You have been seen in the Emergency Department (ED) for abdominal pain.  Your evaluation did not identify a clear cause of your symptoms but was generally reassuring.  Abdominal pain has many possible causes. Some aren't serious and get better on their own in a few days. Others need more testing and treatment. If your pain continues or gets worse, you need to be rechecked and may need more tests to find out what is wrong. You may need surgery to correct the problem.   follow-up with your GI doctor when your symptoms resolved for repeat endoscopy to evaluate your stomach as it looked inflamed on the CT scan.  Follow up with your doctor in 12-24 hours if you are still having abdominal pain. Otherwise follow up in 1-3 days for a re-check  Don't ignore new symptoms, such as fever, nausea and vomiting, new or worsening abdominal pain, urination problems, bloody diarrhea or bloody stools, black tarry stools, uncontrollable nausea and vomiting, and dizziness. These may be signs of a more serious problem. If you develop any of these you should be seen by your doctor immediately or return to the ED.   How can you care for yourself at home?  Rest until you feel better.  To prevent dehydration, drink plenty of fluids, enough so that your urine is light yellow or clear like water. Choose water and other caffeine-free clear liquids until you feel better. If you have kidney, heart, or liver disease and have to limit fluids, talk with your doctor before you increase the amount of fluids you drink.  If your stomach is upset, eat mild foods, such as rice, dry toast or crackers, bananas, and applesauce. Try eating several small meals instead of two or three large ones.  Wait until 48 hours after all symptoms have gone away before you have spicy foods, alcohol, and drinks that contain caffeine.  Do not eat foods that are high in fat.  Avoid anti-inflammatory medicines such as aspirin, ibuprofen (Advil, Motrin), and  naproxen (Aleve). These can cause stomach upset. Talk to your doctor if you take daily aspirin for another health problem.  When should you call for help?  Call 911 anytime you think you may need emergency care. For example, call if:  You passed out (lost consciousness).  You pass maroon or very bloody stools.  You vomit blood or what looks like coffee grounds.  You have new, severe belly pain.  Call your doctor now or seek immediate medical care if:  Your pain gets worse, especially if it becomes focused in one area of your belly.  You have a new or higher fever.  Your stools are black and look like tar, or they have streaks of blood.  You have unexpected vaginal bleeding.  You have symptoms of a urinary tract infection. These may include:  Pain when you urinate.  Urinating more often than usual.  Blood in your urine. You are dizzy or lightheaded, or you feel like you may faint. Watch closely for changes in your health, and be sure to contact your doctor if:  You are not getting better after 1 day (24 hours).

## 2017-07-12 NOTE — ED Provider Notes (Signed)
[facility] Emergency Department Provider Note  ____________________________________________  Time seen: Approximately 3:12 PM  I have reviewed the triage vital signs and the nursing notes.   HISTORY  Chief Complaint Abdominal Pain   HPI  Ricky King is a 51 y.o. male with a history of fibromyalgia, chronic hepatitis C, CAD on Plavix, peripheral vascular disease, hypertension, GERD, hyperlipidemia who presents for evaluation 2 days N/V/D and diffuse cramping abdominal pain radiating to b/l flanks. patient currently endorses 10 out of 10 pain. Has had several episodes of nonbloody nonbilious emesis and watery diarrhea. No melena, no blood, no fever, no chills, no dysuria or hematuria, no known sick contact exposures. Has not tried anything at home for the pain. No prior abdominal surgeries.  Past Medical History:  Diagnosis Date  . Benign essential hypertension   . Chest pain   . Chronic hepatitis C without hepatic coma ([place])   . Fibromyalgia   . GERD (gastroesophageal reflux disease)   . Hyperlipidemia, mixed   . Hypertriglyceridemia   . Platelet inhibition due to Plavix   . PVD (peripheral vascular disease) ([place])     There are no active problems to display for this patient.   Past Surgical History:  Procedure Laterality Date  . ESOPHAGOGASTRODUODENOSCOPY (EGD) WITH PROPOFOL N/A 09/21/2015   Procedure: ESOPHAGOGASTRODUODENOSCOPY (EGD) WITH PROPOFOL;  Surgeon: Josefine Class, MD;  Location: [facility] ENDOSCOPY;  Service: Endoscopy;  Laterality: N/A;    Prior to Admission medications   Medication Sig Start Date End Date Taking? Authorizing Provider  alum & mag hydroxide-simeth (MAALOX MAX) 400-400-40 MG/5ML suspension Take 5 mLs by mouth every 6 (six) hours as needed for indigestion. 07/12/17   Rudene Re, MD  aspirin 81 MG chewable tablet Chew by mouth daily.    [provider]  atorvastatin (LIPITOR) 40 MG tablet Take 40 mg by  mouth daily.    [provider]  clopidogrel (PLAVIX) 75 MG tablet Take 75 mg by mouth daily.    [provider]  diclofenac sodium (VOLTAREN) 1 % GEL Apply topically 4 (four) times daily.    [provider]  dicyclomine (BENTYL) 10 MG capsule Take 1 capsule (10 mg total) by mouth 4 (four) times daily. 07/12/17 07/26/17  Rudene Re, MD  gemfibrozil (LOPID) 600 MG tablet Take 600 mg by mouth 2 (two) times daily before a meal.    [provider]  lisinopril (PRINIVIL,ZESTRIL) 20 MG tablet Take 20 mg by mouth daily.    [provider]  methocarbamol (ROBAXIN-750) 750 MG tablet Take 2 tablets (1,500 mg total) by mouth 4 (four) times daily. 10/24/15   Sable Feil, PA-C  metoprolol succinate (TOPROL-XL) 25 MG 24 hr tablet Take 25 mg by mouth daily.    [provider]  nortriptyline (PAMELOR) 25 MG capsule Take 25 mg by mouth at bedtime.    [provider]  ondansetron (ZOFRAN ODT) 4 MG disintegrating tablet Take 1 tablet (4 mg total) by mouth every 8 (eight) hours as needed for nausea or vomiting. 07/12/17   Alfred Levins, [place], MD  pregabalin (LYRICA) 150 MG capsule Take 150 mg by mouth 2 (two) times daily.    [provider]  topiramate (TOPAMAX SPRINKLE) 25 MG capsule Take 25 mg by mouth 2 (two) times daily.    [provider]  traMADol (ULTRAM) 50 MG tablet Take 1 tablet (50 mg total) by mouth every 6 (six) hours as needed for moderate pain. 10/24/15   Tamala Julian,  Hinda Lenis, PA-C    Allergies Duloxetine and Morphine and related  History reviewed. No pertinent family history.  Social History Social History  Substance Use Topics  . Smoking status: Current Every Day Smoker    Packs/day: 0.50    Types: Cigarettes  . Smokeless tobacco: Never Used  . Alcohol use No    Review of Systems  Constitutional: Negative for fever. Eyes: Negative for visual changes. ENT: Negative for sore throat. Neck: No neck pain    Cardiovascular: Negative for chest pain. Respiratory: Negative for shortness of breath. Gastrointestinal: + diffuse abdominal pain, vomiting and diarrhea. Genitourinary: Negative for dysuria. Musculoskeletal: Negative for back pain. Skin: Negative for rash. Neurological: Negative for headaches, weakness or numbness. Psych: No SI or HI  ____________________________________________   PHYSICAL EXAM:  VITAL SIGNS: ED Triage Vitals  Enc Vitals Group     BP 07/12/17 1314 108/75     Pulse Rate 07/12/17 1314 (!) 107     Resp 07/12/17 1314 16     Temp 07/12/17 1314 97.9 F (36.6 C)     Temp Source 07/12/17 1314 Oral     SpO2 07/12/17 1314 99 %     Weight 07/12/17 1315 204 lb (92.5 kg)     Height --      Head Circumference --      Peak Flow --      Pain Score 07/12/17 1313 10     Pain Loc --      Pain Edu? --      Excl. in [place]? --     Constitutional: Alert and oriented. Well appearing and in no apparent distress. HEENT:      Head: Normocephalic and atraumatic.         Eyes: Conjunctivae are normal. Sclera is non-icteric.       Mouth/Throat: Mucous membranes are moist.       Neck: Supple with no signs of meningismus. Cardiovascular: tachycardic with regular rhythm. No murmurs, gallops, or rubs. 2+ symmetrical distal pulses are present in all extremities. No JVD. Respiratory: Normal respiratory effort. Lungs are clear to auscultation bilaterally. No wheezes, crackles, or rhonchi.  Gastrointestinal: Soft, diffuse ttp over the mid abdomen, no RLQ or RUQ ttp, and non distended with positive bowel sounds. No rebound or guarding. Genitourinary: No CVA tenderness. Musculoskeletal: Nontender with normal range of motion in all extremities. No edema, cyanosis, or erythema of extremities. Neurologic: Normal speech and language. Face is symmetric. Moving all extremities. No gross focal neurologic deficits are appreciated. Skin: Skin is warm, dry and intact. No rash noted. Psychiatric: Mood  and affect are normal. Speech and behavior are normal.  ____________________________________________   LABS (all labs ordered are listed, but only abnormal results are displayed)  Labs Reviewed  COMPREHENSIVE METABOLIC PANEL - Abnormal; Notable for the following:       Result Value   Sodium 132 (*)    Chloride 99 (*)    Glucose, Bld 121 (*)    Creatinine, Ser 2.06 (*)    AST 47 (*)    Total Bilirubin 1.9 (*)    GFR calc non Af Amer 35 (*)    GFR calc Af Amer 41 (*)    All other components within normal limits  URINALYSIS, COMPLETE (UACMP) WITH MICROSCOPIC - Abnormal; Notable for the following:    Color, Urine YELLOW (*)    APPearance HAZY (*)    Hgb urine dipstick SMALL (*)    Protein, ur 100 (*)    Squamous  Epithelial / LPF 0-5 (*)    All other components within normal limits  LIPASE, BLOOD  CBC  TROPONIN I   ____________________________________________  EKG  ED ECG REPORT I, Rudene Re, the attending physician, personally viewed and interpreted this ECG.  Normal sinus rhythm, rate of 88, normal intervals, normal axis, diffuse T-wave flattening, no ST elevations or depressions.no significant changes when compared to prior from 2014 ____________________________________________  RADIOLOGY  CT renal: 1. Thick appearance of the distal stomach, please correlate for gastritis symptoms and consider endoscopy. 2. Small calculi within the gallbladder neck. The gallbladder is collapsed. No acute cholecystitis. 3. Diffuse colonic fluid, is there diarrheal illness? No bowel wall thickening. 4. Hepatic steatosis. 5. Advanced atherosclerosis. Status post aortic iliac stenting. 6. Asymmetric right renal atrophy that has developed since 2014, question chronic renal artery stenosis given the degree of atherosclerotic plaque. ____________________________________________   PROCEDURES  Procedure(s) performed: None Procedures Critical Care performed:   None ____________________________________________   INITIAL IMPRESSION / ASSESSMENT AND PLAN / ED COURSE  51 y.o. male with a history of fibromyalgia, chronic hepatitis C, CAD on Plavix, peripheral vascular disease, hypertension, GERD, hyperlipidemia who presents for evaluation 2 days N/V/D and diffuse cramping abdominal pain radiating to b/l flanks.patient looks slightly dehydrated with mild tachycardia, we'll put a line and give IV fluids will also give Zofran.labs otherwise showing stable creatinine. CT scan concerning for thick appearance of the distal stomach and a diffuse colonic fluid consistent with gastroenteritis. Patient be referred to GI for endoscopy as an outpatient once he symptoms have improved    _________________________ 4:53 PM on 07/12/2017 -----------------------------------------  Patient is tolerating by mouth. Has received a liter of normal saline. She is improved at this time. We'll discharge home with Maalox, Bentyl, Zofran, and follow-up with patient's GI doctor for repeat endoscopy as recommended by radiology. Discussed return precautions with patient.  Pertinent labs & imaging results that were available during my care of the patient were reviewed by me and considered in my medical decision making (see chart for details).    ____________________________________________   FINAL CLINICAL IMPRESSION(S) / ED DIAGNOSES  Final diagnoses:  Nausea vomiting and diarrhea  Gastroenteritis      NEW MEDICATIONS STARTED DURING THIS VISIT:  New Prescriptions   ALUM & MAG HYDROXIDE-SIMETH (MAALOX MAX) 127-517-00 MG/5ML SUSPENSION    Take 5 mLs by mouth every 6 (six) hours as needed for indigestion.   DICYCLOMINE (BENTYL) 10 MG CAPSULE    Take 1 capsule (10 mg total) by mouth 4 (four) times daily.   ONDANSETRON (ZOFRAN ODT) 4 MG DISINTEGRATING TABLET    Take 1 tablet (4 mg total) by mouth every 8 (eight) hours as needed for nausea or vomiting.     Note:  This  document was prepared using Dragon voice recognition software and may include unintentional dictation errors.    Alfred Levins, [place], MD 07/12/17 [phone]

## 2017-11-07 ENCOUNTER — Encounter: Payer: Self-pay | Admitting: Medical Oncology

## 2017-11-07 ENCOUNTER — Emergency Department
Admission: EM | Admit: 2017-11-07 | Discharge: 2017-11-07 | Disposition: A | Discharge status: Home / Self Care | Payer: Medicaid Other | Attending: Emergency Medicine | Admitting: Emergency Medicine

## 2017-11-07 DIAGNOSIS — I739 Peripheral vascular disease, unspecified: Secondary | ICD-10-CM | POA: Insufficient documentation

## 2017-11-07 DIAGNOSIS — Z7902 Long term (current) use of antithrombotics/antiplatelets: Secondary | ICD-10-CM | POA: Diagnosis not present

## 2017-11-07 DIAGNOSIS — B1920 Unspecified viral hepatitis C without hepatic coma: Secondary | ICD-10-CM | POA: Diagnosis not present

## 2017-11-07 DIAGNOSIS — Z7982 Long term (current) use of aspirin: Secondary | ICD-10-CM | POA: Insufficient documentation

## 2017-11-07 DIAGNOSIS — Z79899 Other long term (current) drug therapy: Secondary | ICD-10-CM | POA: Diagnosis not present

## 2017-11-07 DIAGNOSIS — J209 Acute bronchitis, unspecified: Secondary | ICD-10-CM | POA: Diagnosis not present

## 2017-11-07 DIAGNOSIS — J069 Acute upper respiratory infection, unspecified: Secondary | ICD-10-CM | POA: Diagnosis not present

## 2017-11-07 DIAGNOSIS — F17210 Nicotine dependence, cigarettes, uncomplicated: Secondary | ICD-10-CM | POA: Diagnosis not present

## 2017-11-07 DIAGNOSIS — R05 Cough: Secondary | ICD-10-CM | POA: Diagnosis present

## 2017-11-07 DIAGNOSIS — I10 Essential (primary) hypertension: Secondary | ICD-10-CM | POA: Insufficient documentation

## 2017-11-07 MED ORDER — PREDNISONE 10 MG PO TABS: ORAL_TABLET | ORAL | 0 refills | Status: DC

## 2017-11-07 MED ORDER — GUAIFENESIN-CODEINE 100-10 MG/5ML PO SYRP: 5.0000 mL | ORAL_SOLUTION | Freq: Three times a day (TID) | ORAL | 0 refills | Status: DC | PRN

## 2017-11-07 NOTE — Discharge Instructions (Signed)
Follow-up with your primary care provider if any continued problems.  Increase fluids.  Decrease smoking or stop completely.  Ibuprofen as needed for body aches or headache.  Take Cheratussin AC 3 times a day if needed for cough.

## 2017-11-07 NOTE — ED Provider Notes (Signed)
[facility] Emergency Department Provider Note  ____________________________________________   First MD Initiated Contact with Patient 11/07/17 1412     (approximate)  I have reviewed the triage vital signs and the nursing notes.   HISTORY  Chief Complaint Cough and Chills   HPI Ricky King is a 52 y.o. male is here with complaint of cold symptoms for last 3 days.  Patient is unaware of any fever.  He has been taking over-the-counter Robitussin with minimal relief of his cough.  Patient states he is a smoker.  Patient denies any chills or body aches.  He also denies any nausea, vomiting or diarrhea.  He rates his pain as a 0/10.   Past Medical History:  Diagnosis Date  . Benign essential hypertension   . Chest pain   . Chronic hepatitis C without hepatic coma ([place])   . Fibromyalgia   . GERD (gastroesophageal reflux disease)   . Hyperlipidemia, mixed   . Hypertriglyceridemia   . Platelet inhibition due to Plavix   . PVD (peripheral vascular disease) ([place])     There are no active problems to display for this patient.   Past Surgical History:  Procedure Laterality Date  . ESOPHAGOGASTRODUODENOSCOPY (EGD) WITH PROPOFOL N/A 09/21/2015   Procedure: ESOPHAGOGASTRODUODENOSCOPY (EGD) WITH PROPOFOL;  Surgeon: Josefine Class, MD;  Location: [facility] ENDOSCOPY;  Service: Endoscopy;  Laterality: N/A;    Prior to Admission medications   Medication Sig Start Date End Date Taking? Authorizing Provider  aspirin 81 MG chewable tablet Chew by mouth daily.    [provider]  atorvastatin (LIPITOR) 40 MG tablet Take 40 mg by mouth daily.    [provider]  clopidogrel (PLAVIX) 75 MG tablet Take 75 mg by mouth daily.    [provider]  diclofenac sodium (VOLTAREN) 1 % GEL Apply topically 4 (four) times daily.    [provider]  gemfibrozil (LOPID) 600 MG tablet Take 600 mg by mouth 2 (two) times daily before a meal.     [provider]  guaiFENesin-codeine (CHERATUSSIN AC) 100-10 MG/5ML syrup Take 5 mLs by mouth 3 (three) times daily as needed for cough. 11/07/17   Johnn Hai, PA-C  lisinopril (PRINIVIL,ZESTRIL) 20 MG tablet Take 20 mg by mouth daily.    [provider]  metoprolol succinate (TOPROL-XL) 25 MG 24 hr tablet Take 25 mg by mouth daily.    [provider]  nortriptyline (PAMELOR) 25 MG capsule Take 25 mg by mouth at bedtime.    [provider]  predniSONE (DELTASONE) 10 MG tablet Take 4 tablets once a day for 4 days 11/07/17   Johnn Hai, PA-C  pregabalin (LYRICA) 150 MG capsule Take 150 mg by mouth 2 (two) times daily.    [provider]  topiramate (TOPAMAX SPRINKLE) 25 MG capsule Take 25 mg by mouth 2 (two) times daily.    [provider]    Allergies Duloxetine and Morphine and related  No family history on file.  Social History Social History   Tobacco Use  . Smoking status: Current Every Day Smoker    Packs/day: 0.50    Types: Cigarettes  . Smokeless tobacco: Never Used  Substance Use Topics  . Alcohol use: No  . Drug use: No    Review of Systems Constitutional: No fever/chills ENT: Positive for throat irritation. Cardiovascular: Denies chest pain. Respiratory: Denies shortness of breath.  Positive for productive cough. Gastrointestinal: No abdominal pain.  No nausea,  no vomiting.  No diarrhea.   Musculoskeletal: Negative for muscle aches. Skin: Negative for rash. Neurological: Positive headache. ___________________________________________   PHYSICAL EXAM:  VITAL SIGNS: ED Triage Vitals [11/07/17 1350]  Enc Vitals Group     BP (!) 160/83     Pulse Rate 98     Resp 19     Temp 97.8 F (36.6 C)     Temp Source Oral     SpO2 98 %     Weight 200 lb (90.7 kg)     Height 5\' 7"  (1.702 m)     Head Circumference      Peak Flow      Pain Score 0     Pain Loc      Pain Edu?      Excl. in [place]?     Constitutional: Alert and oriented. Well appearing and in no acute distress. Eyes: Conjunctivae are normal.  Head: Atraumatic. Nose: Moderate congestion/no rhinnorhea. Mouth/Throat: Mucous membranes are moist.  Oropharynx non-erythematous.  Positive posterior drainage seen. Neck: No stridor.   Hematological/Lymphatic/Immunilogical: No cervical lymphadenopathy. Cardiovascular: Normal rate, regular rhythm. Grossly normal heart sounds.  Good peripheral circulation. Respiratory: Normal respiratory effort.  No retractions. Lungs CTAB.  Frequent coarse cough noted. Neurologic:  Normal speech and language. No gross focal neurologic deficits are appreciated. No gait instability. Skin:  Skin is warm, dry and intact. No rash noted. Psychiatric: Mood and affect are normal. Speech and behavior are normal.  ____________________________________________   LABS (all labs ordered are listed, but only abnormal results are displayed)  Labs Reviewed - No data to display   PROCEDURES  Procedure(s) performed: None  Procedures  Critical Care performed: No  ____________________________________________   INITIAL IMPRESSION / ASSESSMENT AND PLAN / ED COURSE  Patient is encouraged to discontinue smoking.  He is to increase fluids and take ibuprofen as needed.  He was given a prescription for Cheratussin AC to take 3 times a day if needed for cough and congestion.  He will follow-up with his PCP if any continued problems. ____________________________________________   FINAL CLINICAL IMPRESSION(S) / ED DIAGNOSES  Final diagnoses:  Acute bronchitis, unspecified organism  Upper respiratory tract infection, unspecified type     ED Discharge Orders        Ordered    guaiFENesin-codeine (CHERATUSSIN AC) 100-10 MG/5ML syrup  3 times daily PRN     11/07/17 1428    predniSONE (DELTASONE) 10 MG tablet     11/07/17 1428       Note:  This document was prepared using Dragon voice recognition  software and may include unintentional dictation errors.    Johnn Hai, PA-C 11/07/17 1435    Eula Listen, MD 11/07/17 [phone]

## 2017-11-07 NOTE — ED Triage Notes (Signed)
Cold sx's that began 3 days ago.

## 2017-11-07 NOTE — ED Notes (Signed)
See triage note  States he developed cough and chills with some body aches a few days ago  Afebrile on arrival

## 2017-12-25 ENCOUNTER — Ambulatory Visit
Admission: RE | Admit: 2017-12-25 | Payer: Medicaid Other | Source: Ambulatory Visit | Admitting: Unknown Physician Specialty

## 2017-12-25 ENCOUNTER — Encounter: Admission: RE | Payer: Self-pay | Source: Ambulatory Visit

## 2017-12-25 ENCOUNTER — Encounter: Payer: Self-pay | Admitting: Certified Registered Nurse Anesthetist

## 2017-12-25 SURGERY — COLONOSCOPY WITH PROPOFOL
Anesthesia: General

## 2018-08-31 ENCOUNTER — Other Ambulatory Visit: Payer: Self-pay

## 2018-08-31 DIAGNOSIS — Z1211 Encounter for screening for malignant neoplasm of colon: Secondary | ICD-10-CM

## 2018-09-25 ENCOUNTER — Telehealth: Payer: Self-pay

## 2018-09-25 NOTE — Telephone Encounter (Signed)
Patient has been advised per Dr. Hardin Negus received blood thinner request to stop plavix 7 days prior to procedure and restart 7 days after procedure.  Thanks [employer]

## 2018-10-02 ENCOUNTER — Ambulatory Visit: Payer: Medicaid Other | Admitting: Certified Registered"

## 2018-10-02 ENCOUNTER — Encounter: Payer: Self-pay | Admitting: Anesthesiology

## 2018-10-02 ENCOUNTER — Ambulatory Visit
Admission: RE | Admit: 2018-10-02 | Discharge: 2018-10-02 | Disposition: A | Discharge status: Home / Self Care | Payer: Medicaid Other | Source: Ambulatory Visit | Attending: Gastroenterology | Admitting: Gastroenterology

## 2018-10-02 ENCOUNTER — Encounter
Admission: RE | Disposition: A | Discharge status: Home / Self Care | Payer: Self-pay | Source: Ambulatory Visit | Attending: Gastroenterology

## 2018-10-02 DIAGNOSIS — Z79899 Other long term (current) drug therapy: Secondary | ICD-10-CM | POA: Diagnosis not present

## 2018-10-02 DIAGNOSIS — M797 Fibromyalgia: Secondary | ICD-10-CM | POA: Diagnosis not present

## 2018-10-02 DIAGNOSIS — Z86010 Personal history of colonic polyps: Secondary | ICD-10-CM | POA: Insufficient documentation

## 2018-10-02 DIAGNOSIS — K219 Gastro-esophageal reflux disease without esophagitis: Secondary | ICD-10-CM | POA: Insufficient documentation

## 2018-10-02 DIAGNOSIS — D123 Benign neoplasm of transverse colon: Secondary | ICD-10-CM

## 2018-10-02 DIAGNOSIS — B182 Chronic viral hepatitis C: Secondary | ICD-10-CM | POA: Insufficient documentation

## 2018-10-02 DIAGNOSIS — J449 Chronic obstructive pulmonary disease, unspecified: Secondary | ICD-10-CM | POA: Diagnosis not present

## 2018-10-02 DIAGNOSIS — K635 Polyp of colon: Secondary | ICD-10-CM | POA: Diagnosis not present

## 2018-10-02 DIAGNOSIS — I739 Peripheral vascular disease, unspecified: Secondary | ICD-10-CM | POA: Insufficient documentation

## 2018-10-02 DIAGNOSIS — I10 Essential (primary) hypertension: Secondary | ICD-10-CM | POA: Insufficient documentation

## 2018-10-02 DIAGNOSIS — Z1211 Encounter for screening for malignant neoplasm of colon: Secondary | ICD-10-CM

## 2018-10-02 DIAGNOSIS — F17210 Nicotine dependence, cigarettes, uncomplicated: Secondary | ICD-10-CM | POA: Diagnosis not present

## 2018-10-02 DIAGNOSIS — D125 Benign neoplasm of sigmoid colon: Secondary | ICD-10-CM

## 2018-10-02 DIAGNOSIS — E782 Mixed hyperlipidemia: Secondary | ICD-10-CM | POA: Insufficient documentation

## 2018-10-02 DIAGNOSIS — Z09 Encounter for follow-up examination after completed treatment for conditions other than malignant neoplasm: Secondary | ICD-10-CM | POA: Diagnosis present

## 2018-10-02 DIAGNOSIS — Z7982 Long term (current) use of aspirin: Secondary | ICD-10-CM | POA: Insufficient documentation

## 2018-10-02 HISTORY — PX: COLONOSCOPY WITH PROPOFOL: SHX5780

## 2018-10-02 SURGERY — COLONOSCOPY WITH PROPOFOL
Anesthesia: General

## 2018-10-02 MED ORDER — SODIUM CHLORIDE 0.9 % IV SOLN
INTRAVENOUS | Status: DC
  Administered 2018-10-02: 1000 mL via INTRAVENOUS

## 2018-10-02 MED ORDER — PROPOFOL 10 MG/ML IV BOLUS
INTRAVENOUS | Status: DC | PRN
  Administered 2018-10-02: 10 mg via INTRAVENOUS
  Administered 2018-10-02: 90 mg via INTRAVENOUS

## 2018-10-02 MED ORDER — LIDOCAINE HCL (CARDIAC) PF 100 MG/5ML IV SOSY
PREFILLED_SYRINGE | INTRAVENOUS | Status: DC | PRN
  Administered 2018-10-02: 50 mg via INTRAVENOUS

## 2018-10-02 MED ORDER — PROPOFOL 500 MG/50ML IV EMUL
INTRAVENOUS | Status: AC
  Filled 2018-10-02: qty 50

## 2018-10-02 MED ORDER — PROPOFOL 500 MG/50ML IV EMUL
INTRAVENOUS | Status: DC | PRN
  Administered 2018-10-02: 130 ug/kg/min via INTRAVENOUS

## 2018-10-02 MED ORDER — LIDOCAINE HCL (PF) 2 % IJ SOLN
INTRAMUSCULAR | Status: AC
  Filled 2018-10-02: qty 10

## 2018-10-02 MED ORDER — PHENYLEPHRINE HCL 10 MG/ML IJ SOLN
INTRAMUSCULAR | Status: AC
  Filled 2018-10-02: qty 1

## 2018-10-02 NOTE — Op Note (Signed)
[facility] Gastroenterology Patient Name: Ricky King Procedure Date: 10/02/2018 7:34 AM MRN: [id number] Account #: [id number] Date of Birth: Jul 16, 1966 Admit Type: Outpatient Age: 52 Room: [facility] ENDO ROOM 1 Gender: Male Note Status: Finalized Procedure:            Colonoscopy Indications:          High risk colon cancer surveillance: Personal history                        of colonic polyps Providers:            Jonathon Bellows MD, MD Referring MD:         Theotis Burrow (Referring MD) Medicines:            Monitored Anesthesia Care Complications:        No immediate complications. Procedure:            Pre-Anesthesia Assessment:                       - Prior to the procedure, a History and Physical was                        performed, and patient medications, allergies and                        sensitivities were reviewed. The patient's tolerance of                        previous anesthesia was reviewed.                       - The risks and benefits of the procedure and the                        sedation options and risks were discussed with the                        patient. All questions were answered and informed                        consent was obtained.                       - ASA Grade Assessment: II - A patient with mild                        systemic disease.                       After obtaining informed consent, the colonoscope was                        passed under direct vision. Throughout the procedure,                        the patient's blood pressure, pulse, and oxygen                        saturations were monitored continuously. The                        Colonoscope  was introduced through the anus and                        advanced to the the cecum, identified by the                        appendiceal orifice, IC valve and transillumination.                        The colonoscopy was performed with ease. The patient                  tolerated the procedure well. The quality of the bowel                        preparation was good. Findings:      The perianal and digital rectal examinations were normal.      Two sessile polyps were found in the sigmoid colon and transverse colon.       The polyps were 3 to 4 mm in size. These polyps were removed with a cold       biopsy forceps. Resection and retrieval were complete.      The exam was otherwise without abnormality on direct and retroflexion       views. Impression:           - Two 3 to 4 mm polyps in the sigmoid colon and in the                        transverse colon, removed with a cold biopsy forceps.                        Resected and retrieved.                       - The examination was otherwise normal on direct and                        retroflexion views. Recommendation:       - Discharge patient to home (with escort).                       - Resume previous diet.                       - Continue present medications.                       - Await pathology results.                       - Repeat colonoscopy in 5 years for surveillance. Procedure Code(s):    --- Professional ---                       [phone], Colonoscopy, flexible; with biopsy, single or                        multiple Diagnosis Code(s):    --- Professional ---                       Z86.010, Personal history of colonic polyps  D12.5, Benign neoplasm of sigmoid colon                       D12.3, Benign neoplasm of transverse colon (hepatic                        flexure or splenic flexure) CPT copyright 2018 American Medical Association. All rights reserved. The codes documented in this report are preliminary and upon coder review may  be revised to meet current compliance requirements. Jonathon Bellows, MD Jonathon Bellows MD, MD 10/02/2018 7:59:23 AM This report has been signed electronically. Number of Addenda: 0 Note Initiated On: 10/02/2018 7:34 AM Scope  Withdrawal Time: 0 hours 11 minutes 44 seconds  Total Procedure Duration: 0 hours 14 minutes 52 seconds       [facility]

## 2018-10-02 NOTE — Transfer of Care (Signed)
Immediate Anesthesia Transfer of Care Note  Patient: Ricky King  Procedure(s) Performed: COLONOSCOPY WITH PROPOFOL (N/A )  Patient Location: PACU  Anesthesia Type:General  Level of Consciousness: sedated  Airway & Oxygen Therapy: Patient Spontanous Breathing and Patient connected to nasal cannula oxygen  Post-op Assessment: Report given to RN and Post -op Vital signs reviewed and stable  Post vital signs: Reviewed and stable  Last Vitals:  Vitals Value Taken Time  BP    Temp    Pulse 70 10/02/2018  8:02 AM  Resp 15 10/02/2018  8:02 AM  SpO2 97 % 10/02/2018  8:02 AM    Last Pain:  Vitals:   10/02/18 0641  TempSrc: Tympanic  PainSc: 0-No pain         Complications: No apparent anesthesia complications

## 2018-10-02 NOTE — Anesthesia Postprocedure Evaluation (Signed)
Anesthesia Post Note  Patient: Ricky King  Procedure(s) Performed: COLONOSCOPY WITH PROPOFOL (N/A )  Patient location during evaluation: Endoscopy Anesthesia Type: General Level of consciousness: awake and alert Pain management: pain level controlled Vital Signs Assessment: post-procedure vital signs reviewed and stable Respiratory status: spontaneous breathing, nonlabored ventilation, respiratory function stable and patient connected to nasal cannula oxygen Cardiovascular status: blood pressure returned to baseline and stable Postop Assessment: no apparent nausea or vomiting Anesthetic complications: no     Last Vitals:  Vitals:   10/02/18 0820 10/02/18 0835  BP:  (!) 149/85  Pulse: 67 64  Resp: 16 15  Temp:    SpO2: 98% 99%    Last Pain:  Vitals:   10/02/18 0641  TempSrc: Tympanic  PainSc: 0-No pain                 Precious Haws [place]

## 2018-10-02 NOTE — H&P (Signed)
Jonathon Bellows, MD [address], [place], [place], [place], [address] [address], [place], [place], [place], [address] Phone: [phone]  Fax: [phone]  Primary Care Physician:  Theotis Burrow, MD   Pre-Procedure History & Physical: HPI:  Ricky King is a 52 y.o. male is here for an colonoscopy.   Past Medical History:  Diagnosis Date  . Benign essential hypertension   . Chest pain   . Chronic hepatitis C without hepatic coma ([place])   . Fibromyalgia   . GERD (gastroesophageal reflux disease)   . Hyperlipidemia, mixed   . Hypertriglyceridemia   . Platelet inhibition due to Plavix   . PVD (peripheral vascular disease) ([place])     Past Surgical History:  Procedure Laterality Date  . ESOPHAGOGASTRODUODENOSCOPY (EGD) WITH PROPOFOL N/A 09/21/2015   Procedure: ESOPHAGOGASTRODUODENOSCOPY (EGD) WITH PROPOFOL;  Surgeon: Josefine Class, MD;  Location: [facility] END[facility]COPY;  Service: Endoscopy;  Laterality: N/A;    Prior to Admission medications   Medication Sig Start Date End Date Taking? Authorizing Provider  aspirin 81 MG chewable tablet Chew by mouth daily.   Yes [provider]  atorvastatin (LIPITOR) 40 MG tablet Take 40 mg by mouth daily.   Yes [provider]  clopidogrel (PLAVIX) 75 MG tablet Take 75 mg by mouth daily.   Yes [provider]  diclofenac sodium (VOLTAREN) 1 % GEL Apply topically 4 (four) times daily.   Yes [provider]  gemfibrozil (LOPID) 600 MG tablet Take 600 mg by mouth 2 (two) times daily before a meal.   Yes [provider]  guaiFENesin-codeine (CHERATUSSIN AC) 100-10 MG/5ML syrup Take 5 mLs by mouth 3 (three) times daily as needed for cough. 11/07/17  Yes Letitia Neri L, PA-C  lisinopril (PRINIVIL,ZESTRIL) 20 MG tablet Take 20 mg by mouth daily.   Yes [provider]  metoprolol succinate (TOPROL-XL) 25 MG 24 hr tablet Take 25 mg by mouth daily.   Yes [provider]    nortriptyline (PAMELOR) 25 MG capsule Take 25 mg by mouth at bedtime.   Yes [provider]  omeprazole (PRILOSEC) 40 MG capsule Take 40 mg by mouth daily.   Yes [provider]  predniSONE (DELTASONE) 10 MG tablet Take 4 tablets once a day for 4 days 11/07/17  Yes Letitia Neri L, PA-C  pregabalin (LYRICA) 150 MG capsule Take 150 mg by mouth 2 (two) times daily.   Yes [provider]  topiramate (TOPAMAX SPRINKLE) 25 MG capsule Take 25 mg by mouth 2 (two) times daily.   Yes [provider]    Allergies as of 08/31/2018 - Review Complete 12/22/2017  Allergen Reaction Noted  . Duloxetine  09/16/2015  . Morphine and related Nausea Only 09/16/2015  . Topiramate Rash 12/22/2017    History reviewed. No pertinent family history.  Social History   Socioeconomic History  . Marital status: Single    Spouse name: Not on file  . Number of children: Not on file  . Years of education: Not on file  . Highest education level: Not on file  Occupational History  . Not on file  Social Needs  . Financial resource strain: Not on file  . Food insecurity:    Worry: Not on file    Inability: Not on file  . Transportation needs:    Medical: Not on file    Non-medical: Not on file  Tobacco Use  . Smoking status: Current Every Day Smoker    Packs/day:  0.50    Types: Cigarettes  . Smokeless tobacco: Never Used  Substance and Sexual Activity  . Alcohol use: No  . Drug use: No  . Sexual activity: Not on file  Lifestyle  . Physical activity:    Days per week: Not on file    Minutes per session: Not on file  . Stress: Not on file  Relationships  . Social connections:    Talks on phone: Not on file    Gets together: Not on file    Attends religious service: Not on file    Active member of club or organization: Not on file    Attends meetings of clubs or organizations: Not on file    Relationship status: Not on file  . Intimate partner violence:    Fear  of current or ex partner: Not on file    Emotionally abused: Not on file    Physically abused: Not on file    Forced sexual activity: Not on file  Other Topics Concern  . Not on file  Social History Narrative  . Not on file    Review of Systems: See HPI, otherwise negative ROS  Physical Exam: BP (!) 130/91   Pulse 66   Temp (!) 96.9 F (36.1 C) (Tympanic)   Resp 18   Ht 5\' 7"  (1.702 m)   Wt 91.6 kg   SpO2 98%   BMI 31.64 kg/m  General:   Alert,  pleasant and cooperative in NAD Head:  Normocephalic and atraumatic. Neck:  Supple; no masses or thyromegaly. Lungs:  Clear throughout to auscultation, normal respiratory effort.    Heart:  +S1, +S2, Regular rate and rhythm, No edema. Abdomen:  Soft, nontender and nondistended. Normal bowel sounds, without guarding, and without rebound.   Neurologic:  Alert and  oriented x4;  grossly normal neurologically.  Impression/Plan: Ricky King is here for an colonoscopy to be performed for surveillance due to prior history of colon polyps   Risks, benefits, limitations, and alternatives regarding  colonoscopy have been reviewed with the patient.  Questions have been answered.  All parties agreeable.   Jonathon Bellows, MD  10/02/2018, 7:37 AM

## 2018-10-02 NOTE — Anesthesia Procedure Notes (Signed)
Performed by: Aylissa Heinemann, CRNA Pre-anesthesia Checklist: Patient identified, Emergency Drugs available, Suction available, Patient being monitored and Timeout performed Patient Re-evaluated:Patient Re-evaluated prior to induction Oxygen Delivery Method: Simple face mask Induction Type: IV induction       

## 2018-10-02 NOTE — Anesthesia Post-op Follow-up Note (Signed)
Anesthesia QCDR form completed.        

## 2018-10-02 NOTE — Anesthesia Preprocedure Evaluation (Signed)
Anesthesia Evaluation  Patient identified by MRN, date of birth, ID band Patient awake    Reviewed: Allergy & Precautions, H&P , NPO status , Patient's Chart, lab work & pertinent test results  History of Anesthesia Complications Negative for: history of anesthetic complications  Airway Mallampati: III  TM Distance: >3 FB Neck ROM: full    Dental  (+) Chipped, Poor Dentition, Missing   Pulmonary COPD, Current Smoker,           Cardiovascular Exercise Tolerance: Good hypertension, (-) angina+ Peripheral Vascular Disease  (-) Past MI and (-) DOE      Neuro/Psych  Neuromuscular disease negative psych ROS   GI/Hepatic GERD  Medicated and Controlled,(+) Hepatitis -, C  Endo/Other  negative endocrine ROS  Renal/GU negative Renal ROS  negative genitourinary   Musculoskeletal  (+) Fibromyalgia -  Abdominal   Peds  Hematology negative hematology ROS (+)   Anesthesia Other Findings Past Medical History: No date: Benign essential hypertension No date: Chest pain No date: Chronic hepatitis C without hepatic coma ([facility]) No date: Fibromyalgia No date: GERD (gastroesophageal reflux disease) No date: Hyperlipidemia, mixed No date: Hypertriglyceridemia No date: Platelet inhibition due to Plavix No date: PVD (peripheral vascular disease) ([facility])  Past Surgical History: 09/21/2015: ESOPHAGOGASTRODUODENOSCOPY (EGD) WITH PROPOFOL; N/A     Comment:  Procedure: ESOPHAGOGASTRODUODENOSCOPY (EGD) WITH               PROPOFOL;  Surgeon: Josefine Class, MD;  Location:               [facility] ENDOSCOPY;  Service: Endoscopy;  Laterality: N/A;  BMI    Body Mass Index:  31.64 kg/m      Reproductive/Obstetrics negative OB ROS                             Anesthesia Physical Anesthesia Plan  ASA: III  Anesthesia Plan: General   Post-op Pain Management:    Induction: Intravenous  PONV Risk Score and Plan:  Propofol infusion and TIVA  Airway Management Planned: Natural Airway and Nasal Cannula  Additional Equipment:   Intra-op Plan:   Post-operative Plan:   Informed Consent: I have reviewed the patients History and Physical, chart, labs and discussed the procedure including the risks, benefits and alternatives for the proposed anesthesia with the patient or authorized representative who has indicated his/her understanding and acceptance.   Dental Advisory Given  Plan Discussed with: Anesthesiologist, CRNA and Surgeon  Anesthesia Plan Comments: (Patient consented for risks of anesthesia including but not limited to:  - adverse reactions to medications - risk of intubation if required - damage to teeth, lips or other oral mucosa - sore throat or hoarseness - Damage to heart, brain, lungs or loss of life  Patient voiced understanding.)        Anesthesia Quick Evaluation

## 2018-10-03 ENCOUNTER — Encounter: Payer: Self-pay | Admitting: Gastroenterology

## 2018-10-04 LAB — SURGICAL PATHOLOGY

## 2018-10-07 ENCOUNTER — Encounter: Payer: Self-pay | Admitting: Gastroenterology

## 2018-11-19 ENCOUNTER — Other Ambulatory Visit: Payer: Self-pay

## 2018-11-19 ENCOUNTER — Emergency Department
Admission: EM | Admit: 2018-11-19 | Discharge: 2018-11-19 | Disposition: A | Discharge status: Home / Self Care | Payer: Medicaid Other | Attending: Emergency Medicine | Admitting: Emergency Medicine

## 2018-11-19 ENCOUNTER — Encounter: Payer: Self-pay | Admitting: Emergency Medicine

## 2018-11-19 DIAGNOSIS — I10 Essential (primary) hypertension: Secondary | ICD-10-CM | POA: Diagnosis not present

## 2018-11-19 DIAGNOSIS — Z7982 Long term (current) use of aspirin: Secondary | ICD-10-CM | POA: Insufficient documentation

## 2018-11-19 DIAGNOSIS — H16001 Unspecified corneal ulcer, right eye: Secondary | ICD-10-CM | POA: Diagnosis not present

## 2018-11-19 DIAGNOSIS — Z7902 Long term (current) use of antithrombotics/antiplatelets: Secondary | ICD-10-CM | POA: Insufficient documentation

## 2018-11-19 DIAGNOSIS — H5711 Ocular pain, right eye: Secondary | ICD-10-CM | POA: Diagnosis present

## 2018-11-19 DIAGNOSIS — F17210 Nicotine dependence, cigarettes, uncomplicated: Secondary | ICD-10-CM | POA: Diagnosis not present

## 2018-11-19 DIAGNOSIS — Z79899 Other long term (current) drug therapy: Secondary | ICD-10-CM | POA: Insufficient documentation

## 2018-11-19 MED ORDER — MOXIFLOXACIN HCL 0.5 % OP SOLN
1.0000 [drp] | Freq: Three times a day (TID) | OPHTHALMIC | Status: DC
  Administered 2018-11-19: 1 [drp] via OPHTHALMIC
  Filled 2018-11-19: qty 3

## 2018-11-19 MED ORDER — EYE WASH OPHTH SOLN
1.0000 [drp] | OPHTHALMIC | Status: DC | PRN
  Administered 2018-11-19: 1 [drp] via OPHTHALMIC
  Filled 2018-11-19: qty 118

## 2018-11-19 MED ORDER — FLUORESCEIN SODIUM 1 MG OP STRP
1.0000 | ORAL_STRIP | Freq: Once | OPHTHALMIC | Status: AC
  Administered 2018-11-19: 1 via OPHTHALMIC
  Filled 2018-11-19: qty 1

## 2018-11-19 MED ORDER — TETRACAINE HCL 0.5 % OP SOLN
1.0000 [drp] | Freq: Once | OPHTHALMIC | Status: AC
  Administered 2018-11-19: 1 [drp] via OPHTHALMIC
  Filled 2018-11-19: qty 4

## 2018-11-19 MED ORDER — ACETAMINOPHEN 500 MG PO TABS
1000.0000 mg | ORAL_TABLET | Freq: Once | ORAL | Status: AC
  Administered 2018-11-19: 1000 mg via ORAL
  Filled 2018-11-19: qty 2

## 2018-11-19 NOTE — ED Triage Notes (Signed)
C/O right eye pain and redness x 10 days.

## 2018-11-19 NOTE — ED Provider Notes (Signed)
[facility] Emergency Department Provider Note  ____________________________________________   First MD Initiated Contact with Patient 11/19/18 1401     (approximate)  I have reviewed the triage vital signs and the nursing notes.   HISTORY  Chief Complaint Eye Pain   HPI Ricky King is a 53 y.o. male   presents to the ED with complaint of right eye pain and redness for 10 days.  Patient states that he has had some clear fluid from his eye and there is been some crusting of his eyelashes most mornings when he wakes up.  He denies any allergies or injuries.  Patient states he was at [facility] to get glasses but did not mention anything about his eye at the time when he went to pick up his glasses.  He denies any visual changes.  Does not use contacts.  He denies photophobia.  No history of diabetes.   Past Medical History:  Diagnosis Date  . Benign essential hypertension   . Chest pain   . Chronic hepatitis C without hepatic coma ([place])   . Fibromyalgia   . GERD (gastroesophageal reflux disease)   . Hyperlipidemia, mixed   . Hypertriglyceridemia   . Platelet inhibition due to Plavix   . PVD (peripheral vascular disease) ([place])     There are no active problems to display for this patient.   Past Surgical History:  Procedure Laterality Date  . COLONOSCOPY WITH PROPOFOL N/A 10/02/2018   Procedure: COLONOSCOPY WITH PROPOFOL;  Surgeon: Jonathon Bellows, MD;  Location: [facility] ENDOSCOPY;  Service: Gastroenterology;  Laterality: N/A;  . ESOPHAGOGASTRODUODENOSCOPY (EGD) WITH PROPOFOL N/A 09/21/2015   Procedure: ESOPHAGOGASTRODUODENOSCOPY (EGD) WITH PROPOFOL;  Surgeon: Josefine Class, MD;  Location: [facility] ENDOSCOPY;  Service: Endoscopy;  Laterality: N/A;    Prior to Admission medications   Medication Sig Start Date End Date Taking? Authorizing Provider  aspirin 81 MG chewable tablet Chew by mouth daily.    [provider]  atorvastatin  (LIPITOR) 40 MG tablet Take 40 mg by mouth daily.    [provider]  clopidogrel (PLAVIX) 75 MG tablet Take 75 mg by mouth daily.    [provider]  diclofenac sodium (VOLTAREN) 1 % GEL Apply topically 4 (four) times daily.    [provider]  gemfibrozil (LOPID) 600 MG tablet Take 600 mg by mouth 2 (two) times daily before a meal.    [provider]  guaiFENesin-codeine (CHERATUSSIN AC) 100-10 MG/5ML syrup Take 5 mLs by mouth 3 (three) times daily as needed for cough. 11/07/17   Johnn Hai, PA-C  lisinopril (PRINIVIL,ZESTRIL) 20 MG tablet Take 20 mg by mouth daily.    [provider]  metoprolol succinate (TOPROL-XL) 25 MG 24 hr tablet Take 25 mg by mouth daily.    [provider]  nortriptyline (PAMELOR) 25 MG capsule Take 25 mg by mouth at bedtime.    [provider]  omeprazole (PRILOSEC) 40 MG capsule Take 40 mg by mouth daily.    [provider]  predniSONE (DELTASONE) 10 MG tablet Take 4 tablets once a day for 4 days 11/07/17   Johnn Hai, PA-C  pregabalin (LYRICA) 150 MG capsule Take 150 mg by mouth 2 (two) times daily.    [provider]  topiramate (TOPAMAX SPRINKLE) 25 MG capsule Take 25 mg by mouth 2 (two) times daily.    [provider]    Allergies Duloxetine; Morphine and related; and Topiramate  No  family history on file.  Social History Social History   Tobacco Use  . Smoking status: Current Every Day Smoker    Packs/day: 0.50    Types: Cigarettes  . Smokeless tobacco: Never Used  Substance Use Topics  . Alcohol use: No  . Drug use: No    Review of Systems Constitutional: No fever/chills Eyes: No visual changes.  Positive right eye pain. ENT: No sore throat. Cardiovascular: Denies chest pain. Respiratory: Denies shortness of breath. Gastrointestinal: No abdominal pain.  No nausea, no vomiting.  Musculoskeletal: Negative for back pain. Skin: Negative for  rash. Neurological: Negative for headaches, focal weakness or numbness. ____________________________________________   PHYSICAL EXAM:  VITAL SIGNS: ED Triage Vitals  Enc Vitals Group     BP 11/19/18 1310 (!) 141/75     Pulse Rate 11/19/18 1310 77     Resp 11/19/18 1310 15     Temp 11/19/18 1310 98.2 F (36.8 C)     Temp Source 11/19/18 1310 Oral     SpO2 11/19/18 1310 97 %     Weight 11/19/18 1301 201 lb (91.2 kg)     Height 11/19/18 1301 5' 7.5" (1.715 m)     Head Circumference --      Peak Flow --      Pain Score 11/19/18 1301 7     Pain Loc --      Pain Edu? --      Excl. in [place]? --    Constitutional: Alert and oriented. Well appearing and in no acute distress. Eyes: Right conjunctive is minimally erythematous.  No exudate is noted.  No obvious injury.  Tetracaine was placed in the right eye.  Lid was everted and no foreign body noted.  No periorbital edema present.  PERRL. EOMI. there is a questionable early corneal ulcer noted at approximately 1:00.  There is foreseen uptake in this area.  No corneal abrasion present.  Tono-Pen was used.  Readings to the right eye was 20 and 21.  Left eye was 18 and 20. Head: Atraumatic. Nose: No congestion/rhinnorhea. Neck: No stridor.   Cardiovascular: Normal rate, regular rhythm. Grossly normal heart sounds.  Good peripheral circulation. Respiratory: Normal respiratory effort.  No retractions. Lungs CTAB. Musculoskeletal: Moves upper and lower extremities without any difficulty.  Normal gait was noted. Neurologic:  Normal speech and language. No gross focal neurologic deficits are appreciated. No gait instability. Skin:  Skin is warm, dry and intact. No rash noted. Psychiatric: Mood and affect are normal. Speech and behavior are normal.  ____________________________________________   LABS (all labs ordered are listed, but only abnormal results are displayed)  Labs Reviewed - No data to display  PROCEDURES  Procedure(s) performed:  None  Procedures  Critical Care performed: No  ____________________________________________   INITIAL IMPRESSION / ASSESSMENT AND PLAN / ED COURSE  As part of my medical decision making, I reviewed the following data within the electronic MEDICAL RECORD NUMBER Notes from prior ED visits and [place] Controlled Substance Database  Patient presents to the ED with complaint of right eye pain for the last 10 days.  He denies any injury, photophobia or use of contacts.  On exam there was findings that was suspicious for a early corneal abrasion at approximately 1:00.  Remainder of the eye exam was negative.  Patient was given Vigamox before leaving the ED as he states he does not have money to be able to get his medication.  He states he is already a patient at [facility].  He plans on calling tomorrow to make an appointment with his ophthalmologist.  Dr. Murvin Natal was written on his discharge papers with phone number.  He was made aware that he needs to have follow-up to make sure this is not getting worse.  He was given strict precautions to return to the ED if not improving or any worsening of his vision.  ____________________________________________   FINAL CLINICAL IMPRESSION(S) / ED DIAGNOSES  Final diagnoses:  Corneal ulcer of right eye     ED Discharge Orders    None       Note:  This document was prepared using Dragon voice recognition software and may include unintentional dictation errors.    Johnn Hai, PA-C 11/19/18 1605    Arta Silence, MD 11/20/18 [phone]

## 2018-11-19 NOTE — Discharge Instructions (Signed)
Call tomorrow and make an appointment with [facility].  I was unable to find who you saw for your initial visit but Dr. Rachell Cipro name is listed on your discharge papers.  Begin using the eyedrops as prescribed to your right eye 3 times a day.  Return to the emergency department if any severe worsening of your eye problem.

## 2019-03-15 ENCOUNTER — Other Ambulatory Visit: Payer: Self-pay | Admitting: Ophthalmology

## 2019-03-15 DIAGNOSIS — H34232 Retinal artery branch occlusion, left eye: Secondary | ICD-10-CM

## 2019-03-21 ENCOUNTER — Ambulatory Visit: Payer: Medicaid Other

## 2019-03-27 ENCOUNTER — Other Ambulatory Visit: Payer: Self-pay

## 2019-03-27 ENCOUNTER — Other Ambulatory Visit
Admission: RE | Admit: 2019-03-27 | Discharge: 2019-03-27 | Disposition: A | Discharge status: Home / Self Care | Payer: Medicaid Other | Source: Ambulatory Visit | Attending: Ophthalmology | Admitting: Ophthalmology

## 2019-03-27 DIAGNOSIS — H34232 Retinal artery branch occlusion, left eye: Secondary | ICD-10-CM | POA: Diagnosis present

## 2019-03-27 LAB — LIPID PANEL
Cholesterol: 204 mg/dL — ABNORMAL HIGH (ref 0–200)
HDL: 26 mg/dL — ABNORMAL LOW (ref >40)
LDL Cholesterol: UNDETERMINED mg/dL (ref 0–99)
Total CHOL/HDL Ratio: 7.8 RATIO
Triglycerides: 430 mg/dL — ABNORMAL HIGH (ref <150)
VLDL: UNDETERMINED mg/dL (ref 0–40)

## 2019-03-27 LAB — HEMOGLOBIN: Hemoglobin: 13.7 g/dL (ref 13.0–17.0)

## 2019-03-28 LAB — LDL CHOLESTEROL, DIRECT: Direct LDL: 120 mg/dL — ABNORMAL HIGH (ref 0–99)

## 2019-04-02 ENCOUNTER — Other Ambulatory Visit: Payer: Self-pay

## 2019-04-02 ENCOUNTER — Ambulatory Visit
Admission: RE | Admit: 2019-04-02 | Discharge: 2019-04-02 | Disposition: A | Discharge status: Home / Self Care | Payer: Medicaid Other | Source: Ambulatory Visit | Attending: Ophthalmology | Admitting: Ophthalmology

## 2019-04-02 DIAGNOSIS — H34232 Retinal artery branch occlusion, left eye: Secondary | ICD-10-CM | POA: Insufficient documentation

## 2019-04-05 ENCOUNTER — Other Ambulatory Visit: Payer: Self-pay

## 2019-04-05 ENCOUNTER — Ambulatory Visit (INDEPENDENT_AMBULATORY_CARE_PROVIDER_SITE_OTHER): Payer: Medicaid Other | Admitting: Vascular Surgery

## 2019-04-05 ENCOUNTER — Encounter (INDEPENDENT_AMBULATORY_CARE_PROVIDER_SITE_OTHER): Payer: Self-pay | Admitting: Vascular Surgery

## 2019-04-05 DIAGNOSIS — I6523 Occlusion and stenosis of bilateral carotid arteries: Secondary | ICD-10-CM | POA: Diagnosis not present

## 2019-04-05 DIAGNOSIS — E785 Hyperlipidemia, unspecified: Secondary | ICD-10-CM | POA: Diagnosis not present

## 2019-04-05 DIAGNOSIS — F17210 Nicotine dependence, cigarettes, uncomplicated: Secondary | ICD-10-CM | POA: Diagnosis not present

## 2019-04-05 DIAGNOSIS — I10 Essential (primary) hypertension: Secondary | ICD-10-CM | POA: Diagnosis not present

## 2019-04-05 DIAGNOSIS — Z79899 Other long term (current) drug therapy: Secondary | ICD-10-CM

## 2019-04-05 DIAGNOSIS — I6529 Occlusion and stenosis of unspecified carotid artery: Secondary | ICD-10-CM | POA: Insufficient documentation

## 2019-04-05 NOTE — Assessment & Plan Note (Signed)
lipid control important in reducing the progression of atherosclerotic disease. Continue statin therapy  

## 2019-04-05 NOTE — Assessment & Plan Note (Signed)
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  Patient should undergo CT angiography of the carotid arteries to define the degree of stenosis of the internal carotid arteries bilaterally and the anatomic suitability for surgery vs. intervention.  If the patient does indeed need surgery cardiac clearance will be required, once cleared the patient will be scheduled for surgery.  The risks, benefits and alternative therapies were reviewed in detail with the patient.  All questions were answered.  The patient agrees to proceed with imaging.  Continue antiplatelet therapy as prescribed. Continue management of CAD, HTN and Hyperlipidemia. Healthy heart diet, encouraged exercise at least 4 times per week.

## 2019-04-05 NOTE — Assessment & Plan Note (Signed)
blood pressure control important in reducing the progression of atherosclerotic disease. On appropriate oral medications.  

## 2019-04-05 NOTE — Progress Notes (Signed)
Patient ID: Ricky King, male   DOB: 05/06/1966, 53 y.o.   MRN: [id number]  Chief Complaint  Patient presents with  . New Patient (Initial Visit)    HPI Ricky King is a 53 y.o. male.  I am asked to see the patient by Dr. Neville Route for evaluation of carotid stenosis.  The patient reports about 2 months ago having a defect in his left visual field.  This never completely improved.  He also describes an episode about a week or 2 ago where his right arm became numb and weak that lasted only a few minutes.  This quickly improved.  He has a known history of previous peripheral vascular disease treated at [facility].  Apparently, we saw him 4 to 5 years ago and performed aortoiliac intervention at that time.  It does not seem like he has been seen here in the last 3 to 4 years.  He also complains of some intermittent chest pain.  He is on aspirin, Plavix, and a statin agent.  He underwent a carotid duplex at the hospital which suggested less than 50% right ICA stenosis but a greater than 70% left ICA stenosis by duplex criteria.  As such, he is referred for further evaluation and treatment   Past Medical History:  Diagnosis Date  . Benign essential hypertension   . Chest pain   . Chronic hepatitis C without hepatic coma ([place])   . Fibromyalgia   . GERD (gastroesophageal reflux disease)   . Hyperlipidemia, mixed   . Hypertriglyceridemia   . Platelet inhibition due to Plavix   . PVD (peripheral vascular disease) ([place])     Past Surgical History:  Procedure Laterality Date  . COLONOSCOPY WITH PROPOFOL N/A 10/02/2018   Procedure: COLONOSCOPY WITH PROPOFOL;  Surgeon: Jonathon Bellows, MD;  Location: [facility] ENDOSCOPY;  Service: Gastroenterology;  Laterality: N/A;  . ESOPHAGOGASTRODUODENOSCOPY (EGD) WITH PROPOFOL N/A 09/21/2015   Procedure: ESOPHAGOGASTRODUODENOSCOPY (EGD) WITH PROPOFOL;  Surgeon: Josefine Class, MD;  Location: [facility] ENDOSCOPY;  Service: Endoscopy;  Laterality: N/A;    Family  History No bleeding disorders, clotting disorders, aneurysms, or autoimmune diseases  Social History Social History   Tobacco Use  . Smoking status: Current Every Day Smoker    Packs/day: 0.50    Types: Cigarettes  . Smokeless tobacco: Never Used  Substance Use Topics  . Alcohol use: No  . Drug use: No     Allergies  Allergen Reactions  . Duloxetine   . Morphine And Related Nausea Only  . Topiramate Rash    Current Outpatient Medications  Medication Sig Dispense Refill  . amLODipine (NORVASC) 10 MG tablet Take 10 mg by mouth daily.    Marland Kitchen aspirin 81 MG chewable tablet Chew by mouth daily.    . clopidogrel (PLAVIX) 75 MG tablet Take 75 mg by mouth daily.    . fenofibrate (TRICOR) 145 MG tablet Take 145 mg by mouth daily.    . metoprolol succinate (TOPROL-XL) 25 MG 24 hr tablet Take 12.5 mg by mouth daily.     Marland Kitchen omeprazole (PRILOSEC) 40 MG capsule Take 40 mg by mouth daily.    . pregabalin (LYRICA) 150 MG capsule Take 150 mg by mouth 2 (two) times daily.    Marland Kitchen atorvastatin (LIPITOR) 40 MG tablet Take 40 mg by mouth daily.    . diclofenac sodium (VOLTAREN) 1 % GEL Apply topically 4 (four) times daily.    Marland Kitchen gemfibrozil (LOPID) 600 MG tablet Take 600 mg by  mouth 2 (two) times daily before a meal.    . guaiFENesin-codeine (CHERATUSSIN AC) 100-10 MG/5ML syrup Take 5 mLs by mouth 3 (three) times daily as needed for cough. (Patient not taking: Reported on 04/05/2019) 120 mL 0  . lisinopril (PRINIVIL,ZESTRIL) 20 MG tablet Take 20 mg by mouth daily.    . nortriptyline (PAMELOR) 25 MG capsule Take 25 mg by mouth at bedtime.    . predniSONE (DELTASONE) 10 MG tablet Take 4 tablets once a day for 4 days (Patient not taking: Reported on 04/05/2019) 16 tablet 0  . topiramate (TOPAMAX SPRINKLE) 25 MG capsule Take 25 mg by mouth 2 (two) times daily.     No current facility-administered medications for this visit.       REVIEW OF SYSTEMS (Negative unless checked)  Constitutional: [] Weight  loss  [] Fever  [] Chills Cardiac: [] Chest pain   [] Chest pressure   [] Palpitations   [] Shortness of breath when laying flat   [] Shortness of breath at rest   [] Shortness of breath with exertion. Vascular:  [] Pain in legs with walking   [] Pain in legs at rest   [] Pain in legs when laying flat   [] Claudication   [] Pain in feet when walking  [] Pain in feet at rest  [] Pain in feet when laying flat   [] History of DVT   [] Phlebitis   [] Swelling in legs   [] Varicose veins   [] Non-healing ulcers Pulmonary:   [] Uses home oxygen   [] Productive cough   [] Hemoptysis   [] Wheeze  [] COPD   [] Asthma Neurologic:  [] Dizziness  [] Blackouts   [] Seizures   [] History of stroke   [x] History of TIA  [] Aphasia   [x] Temporary blindness   [] Dysphagia   [] Weakness or numbness in arms   [] Weakness or numbness in legs Musculoskeletal:  [x] Arthritis   [] Joint swelling   [] Joint pain   [] Low back pain Hematologic:  [x] Easy bruising  [] Easy bleeding   [] Hypercoagulable state   [] Anemic  [] Hepatitis Gastrointestinal:  [] Blood in stool   [] Vomiting blood  [x] Gastroesophageal reflux/heartburn   [] Abdominal pain Genitourinary:  [] Chronic kidney disease   [] Difficult urination  [] Frequent urination  [] Burning with urination   [] Hematuria Skin:  [] Rashes   [] Ulcers   [] Wounds Psychological:  [x] History of anxiety   []  History of major depression.    Physical Exam BP (!) 151/81 (BP Location: Left Arm, Patient Position: Sitting, Cuff Size: Normal)   Pulse 67   Resp 12   Ht 5' 7.5" (1.715 m)   Wt 197 lb (89.4 kg)   BMI 30.40 kg/m  Gen:  WD/WN, NAD Head: [place]/AT, No temporalis wasting. Ear/Nose/Throat: Hearing grossly intact, nares w/o erythema or drainage, oropharynx w/o Erythema/Exudate Eyes: Conjunctiva clear, sclera non-icteric  Neck: trachea midline.  Left carotid bruit Pulmonary:  Good air movement, clear to auscultation bilaterally.  Cardiac: RRR, normal S1, S2 Vascular:  Vessel Right Left  Radial Palpable Palpable                           PT 1+ Palpable 1+ Palpable  DP 1+ Palpable Trace Palpable   Gastrointestinal: soft, non-tender/non-distended. Musculoskeletal: M/S 5/5 throughout.  Extremities without ischemic changes.  No deformity or atrophy. Trace LE edema. Neurologic: Sensation grossly intact in extremities.  Symmetrical.  Speech is fluent. Motor exam as listed above. Psychiatric: Judgment intact, Mood & affect appropriate for pt's clinical situation. Dermatologic: No rashes or ulcers noted.  No cellulitis or open wounds.  Radiology US Carotid  Bilateral  Addendum Date: 04/02/2019   ADDENDUM REPORT: 04/02/2019 14:30 ADDENDUM: Laterality error in the IMPRESSION, should read as follows: 1. Short segment near occlusive stenosis in the proximal LEFT ICA at the higher end of 70-99% diameter stenosis range. Consider vascular surgical or neurointerventional radiology consultation. 2. RIGHT proximal ICA plaque resulting in less than 50% diameter stenosis. 3. Antegrade bilateral vertebral arterial flow. Electronically Signed   By: Lucrezia Europe M.D.   On: 04/02/2019 14:30   Result Date: 04/02/2019 CLINICAL DATA:  LEFT retinal branch artery occlusion. Hypertension, syncope, left eye visual disturbance, hyperlipidemia, previous tobacco abuse. Peripheral arterial disease, post lower extremity stent placement. EXAM: BILATERAL CAROTID DUPLEX ULTRASOUND TECHNIQUE: Pearline Cables scale imaging, color Doppler and duplex ultrasound were performed of bilateral carotid and vertebral arteries in the neck. COMPARISON:  None. FINDINGS: Criteria: Quantification of carotid stenosis is based on velocity parameters that correlate the residual internal carotid diameter with NASCET-based stenosis levels, using the diameter of the distal internal carotid lumen as the denominator for stenosis measurement. The following velocity measurements were obtained: RIGHT ICA: 113/45 cm/sec CCA: 25/05 cm/sec SYSTOLIC ICA/CCA RATIO:  1.4 ECA: 103 cm/sec LEFT ICA:  340/157 cm/sec CCA: 39/76 cm/sec SYSTOLIC ICA/CCA RATIO:  4.4 ECA: 75 cm/sec RIGHT CAROTID ARTERY: Eccentric nonocclusive plaque scattered through the common carotid artery and bulb. Calcified plaque at the bifurcation with some distal acoustic shadowing. Normal waveforms and color Doppler signal. Mild tortuosity of the ICA. RIGHT VERTEBRAL ARTERY:    Normal flow direction and waveform. LEFT CAROTID ARTERY: Intimal thickening in the common carotid artery. Heterogenous plaque in the bulb. Short segment high-grade stenosis in the proximal ICA with markedly elevated peak systolic velocities and focal aliasing on color Doppler interrogation. Parvus tardus waveform distally. LEFT VERTEBRAL ARTERY:  Normal flow direction and waveform. IMPRESSION: 1. Short segment near occlusive stenosis in the proximal RIGHT ICA at the higher end of 70-99% diameter stenosis range. Consider vascular surgical or neurointerventional radiology consultation. 2. LEFT proximal ICA plaque resulting in less than 50% diameter stenosis. 3.  Antegrade bilateral vertebral arterial flow. Electronically Signed: By: Lucrezia Europe M.D. On: 04/02/2019 14:19    Labs Recent Results (from the past 2160 hour(s))  Hemoglobin     Status: None   Collection Time: 03/27/19  2:34 PM  Result Value Ref Range   Hemoglobin 13.7 13.0 - 17.0 g/dL    Comment: Performed at [facility], [address] [place], [place] [address]  Lipid panel     Status: Abnormal   Collection Time: 03/27/19  2:34 PM  Result Value Ref Range   Cholesterol 204 (H) 0 - 200 mg/dL   Triglycerides 430 (H) <150 mg/dL   HDL 26 (L) >40 mg/dL   Total CHOL/HDL Ratio 7.8 RATIO   VLDL UNABLE TO CALCULATE IF TRIGLYCERIDE OVER 400 mg/dL 0 - 40 mg/dL   LDL Cholesterol UNABLE TO CALCULATE IF TRIGLYCERIDE OVER 400 mg/dL 0 - 99 mg/dL    Comment:        Total Cholesterol/HDL:CHD Risk Coronary Heart Disease Risk Table                     Men   Women  1/2 Average Risk   3.4   3.3   Average Risk       5.0   4.4  2 X Average Risk   9.6   7.1  3 X Average Risk  23.4   11.0        Use  the calculated Patient Ratio above and the CHD Risk Table to determine the patient's CHD Risk.        ATP III CLASSIFICATION (LDL):  <100     mg/dL   Optimal  100-129  mg/dL   Near or Above                    Optimal  130-159  mg/dL   Borderline  160-189  mg/dL   High  >190     mg/dL   Very High Performed at [facility], [address] [place], [place] [address]   LDL cholesterol, direct     Status: Abnormal   Collection Time: 03/27/19  2:34 PM  Result Value Ref Range   Direct LDL 120.0 (H) 0 - 99 mg/dL    Comment: Performed at [facility] [address] [place], [place] [address]    Assessment/Plan:  Essential hypertension blood pressure control important in reducing the progression of atherosclerotic disease. On appropriate oral medications.   Hyperlipidemia lipid control important in reducing the progression of atherosclerotic disease. Continue statin therapy   Carotid stenosis The patient has worrisome symptoms including branch retinal artery occlusion likely from embolization as well as some transient right arm numbness and weakness that could be TIA symptoms.  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  Patient should undergo CT angiography of the carotid arteries to define the degree of stenosis of the internal carotid arteries bilaterally and the anatomic suitability for surgery vs. intervention.  If the patient does indeed need surgery cardiac clearance will be required, once cleared the patient will be scheduled for surgery.  The risks, benefits and alternative therapies were reviewed in detail with the patient.  All questions were answered.  The patient agrees to proceed with imaging.  Continue antiplatelet therapy as prescribed. Continue management  of CAD, HTN and Hyperlipidemia. Healthy heart diet, encouraged exercise at least 4 times per week.        Leotis Pain 04/05/2019, 10:22 AM   This note was created with Dragon medical transcription system.  Any errors from dictation are unintentional.

## 2019-04-08 ENCOUNTER — Encounter (INDEPENDENT_AMBULATORY_CARE_PROVIDER_SITE_OTHER): Payer: Self-pay | Admitting: Vascular Surgery

## 2019-04-22 ENCOUNTER — Telehealth (INDEPENDENT_AMBULATORY_CARE_PROVIDER_SITE_OTHER): Payer: Self-pay

## 2019-04-22 NOTE — Telephone Encounter (Signed)
Patient called asking about his surgery. Per the Dr. Bunnie Domino notes the patient is to have a CTA and be seen after to discuss if surgery is needed. This information was given to the patient.

## 2019-04-25 ENCOUNTER — Other Ambulatory Visit: Payer: Self-pay

## 2019-04-25 ENCOUNTER — Ambulatory Visit
Admission: RE | Admit: 2019-04-25 | Discharge: 2019-04-25 | Disposition: A | Discharge status: Home / Self Care | Payer: Medicaid Other | Source: Ambulatory Visit | Attending: Vascular Surgery | Admitting: Vascular Surgery

## 2019-04-25 DIAGNOSIS — I6523 Occlusion and stenosis of bilateral carotid arteries: Secondary | ICD-10-CM | POA: Insufficient documentation

## 2019-04-25 LAB — POCT I-STAT CREATININE: Creatinine, Ser: 1.4 mg/dL — ABNORMAL HIGH (ref 0.61–1.24)

## 2019-04-25 MED ORDER — IOHEXOL 350 MG/ML SOLN
75.0000 mL | Freq: Once | INTRAVENOUS | Status: AC | PRN
  Administered 2019-04-25: 75 mL via INTRAVENOUS

## 2019-05-07 ENCOUNTER — Encounter (INDEPENDENT_AMBULATORY_CARE_PROVIDER_SITE_OTHER): Payer: Self-pay

## 2019-05-07 ENCOUNTER — Ambulatory Visit (INDEPENDENT_AMBULATORY_CARE_PROVIDER_SITE_OTHER): Payer: Medicaid Other | Admitting: Vascular Surgery

## 2019-05-07 ENCOUNTER — Other Ambulatory Visit: Payer: Self-pay

## 2019-05-07 ENCOUNTER — Encounter (INDEPENDENT_AMBULATORY_CARE_PROVIDER_SITE_OTHER): Payer: Self-pay | Admitting: Vascular Surgery

## 2019-05-07 VITALS — BP 177/86 | HR 90 | Resp 17 | Wt 196.0 lb

## 2019-05-07 DIAGNOSIS — I6523 Occlusion and stenosis of bilateral carotid arteries: Secondary | ICD-10-CM

## 2019-05-07 DIAGNOSIS — Z79899 Other long term (current) drug therapy: Secondary | ICD-10-CM

## 2019-05-07 DIAGNOSIS — I10 Essential (primary) hypertension: Secondary | ICD-10-CM

## 2019-05-07 DIAGNOSIS — E785 Hyperlipidemia, unspecified: Secondary | ICD-10-CM

## 2019-05-07 DIAGNOSIS — F17210 Nicotine dependence, cigarettes, uncomplicated: Secondary | ICD-10-CM

## 2019-05-07 NOTE — Patient Instructions (Signed)
Carotid Endarterectomy A carotid endarterectomy is a surgery to remove a blockage in the carotid arteries. The carotid arteries are the large blood vessels on both sides of the neck that supply blood to the brain. Carotid artery disease, also called carotid artery stenosis, is the narrowing or blockage of one or both carotid arteries. Carotid artery disease is usually caused by atherosclerosis, which is a buildup of fat and plaque in the arteries. Some buildup of plaque normally occurs with aging. The plaque may partially or totally block blood flow or cause a clot to form in the carotid arteries. This may cause a stroke. Tell a health care provider about:  Any allergies you have.  All medicines you are taking, including vitamins, herbs, eye drops, creams, and over-the-counter medicines.  Any problems you or family members have had with anesthetic medicines.  Any blood disorders you have.  Any surgeries you have had.  Any medical conditions you have, or have had, including diabetes, kidney problems, and infections.  Whether you are pregnant or may be pregnant. What are the risks? Generally, this is a safe procedure. However, problems may occur, including:  Infection.  Bleeding.  Blood clots.  Allergic reactions to medicines.  Damage to nerves near the carotid arteries. This can cause a hoarse voice or weakness of muscles in your face.  Stroke.  Seizures.  Heart attack (myocardial infarction).  Narrowing of the opened blood vessel (restenosis). This may require another surgery. What happens before the procedure? Staying hydrated Follow instructions from your health care provider about hydration, which may include:  Up to 2 hours before the procedure - you may continue to drink clear liquids, such as water, clear fruit juice, black coffee, and plain tea.  Eating and drinking restrictions Follow instructions from your health care provider about eating and drinking, which may  include:  8 hours before the procedure - stop eating heavy meals or foods, such as meat, fried foods, or fatty foods.  6 hours before the procedure - stop eating light meals or foods, such as toast or cereal.  6 hours before the procedure - stop drinking milk or drinks that contain milk.  2 hours before the procedure - stop drinking clear liquids. Medicines  Ask your health care provider about: ? Changing or stopping your regular medicines. This is especially important if you are taking diabetes medicines or blood thinners. ? Taking medicines such as aspirin and ibuprofen. These medicines can thin your blood. Do not take these medicines unless your health care provider tells you to take them. ? Taking over-the-counter medicines, vitamins, herbs, and supplements. General instructions  Do not use any products that contain nicotine or tobacco for at least 4-6 weeks, or as soon as possible, before the procedure. These products include cigarettes, e-cigarettes, and chewing tobacco. If you need help quitting, ask your health care provider.  You may need to have blood tests, a test to check heart rhythm (electrocardiogram), or a test to check blood flow (angiogram).  Plan to have someone take you home from the hospital or clinic.  Ask your health care provider: ? How your surgery site will be marked. ? What steps will be taken to help prevent infection. These may include:  Removing hair at the surgery site.  Washing skin with a germ-killing soap.  Taking antibiotic medicine. What happens during the procedure?   An IV will be inserted into one of your veins.  You will be given one or more of the following: ?  A medicine to help you relax (sedative). ? A medicine to make you fall asleep (general anesthetic).  The surgeon will make a small incision in your neck to expose the carotid artery.  A tube may be inserted into the carotid artery above and below the blockage. This tube will  allow blood to flow around the blockage during the surgery.  An incision will be made in the carotid artery at the location of the blockage.  The blockage will be removed. In some cases, a section of the carotid artery may be removed and a graft patch may be used to repair the artery.  The carotid artery will be closed with stitches (sutures).  If a tube was inserted into the artery to allow blood flow around the blockage during surgery, the tube will be removed. Once the tube is removed, blood flow through the carotid artery will be restored.  The incision in the neck will be closed with sutures.  A bandage (dressing) will be placed over your incision. The procedure may vary among health care providers and hospitals. What happens after the procedure?  Your blood pressure, heart rate, breathing rate, and blood oxygen level will be monitored until you leave the hospital or clinic.  You may have some pain or an ache in your neck for about 2 weeks. This is normal.  Do not drive for 24 hours if you were given a sedative during your procedure. Summary  A carotid endarterectomy is a surgery to remove a blockage in the carotid arteries.  The carotid arteries are the large blood vessels on both sides of the neck that supply blood to the brain.  Before the procedure, ask your health care provider about changing or stopping your regular medicines.  Follow instructions from your health care provider about eating and drinking before the procedure.  After the procedure, do not drive for 24 hours if you were given a sedative. This information is not intended to replace advice given to you by your health care provider. Make sure you discuss any questions you have with your health care provider. Document Released: 06/12/2013 Document Revised: 06/19/2018 Document Reviewed: 06/19/2018 Elsevier Patient Education  2020 [employer].

## 2019-05-07 NOTE — Progress Notes (Signed)
MRN : [id number]  Ricky King is a 53 y.o. (1966-03-08) male who presents with chief complaint of  Chief Complaint  Patient presents with   Follow-up    ct results  .  History of Present Illness: Patient returns today in follow up of his carotid disease.  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  No symptoms since his last visit to our office.  No major changes or other issues in that timeframe either. I have independently reviewed the patient's CT angiogram.  He has by the radiologist report a 50 to 70% right ICA stenosis and a greater than 80% left ICA stenosis.  I would actually interpret his study significantly worse with a 70% right ICA stenosis and a 90% left ICA stenosis based off of the measurements at the level of the disease.  Past Medical History:  Diagnosis Date   Benign essential hypertension    Chest pain    Chronic hepatitis C without hepatic coma (HCC)    Fibromyalgia    GERD (gastroesophageal reflux disease)    Hyperlipidemia, mixed    Hypertriglyceridemia    Platelet inhibition due to Plavix    PVD (peripheral vascular disease) ([place])          Past Surgical History:  Procedure Laterality Date   COLONOSCOPY WITH PROPOFOL N/A 10/02/2018   Procedure: COLONOSCOPY WITH PROPOFOL;  Surgeon: Jonathon Bellows, MD;  Location: [facility] ENDOSCOPY;  Service: Gastroenterology;  Laterality: N/A;   ESOPHAGOGASTRODUODENOSCOPY (EGD) WITH PROPOFOL N/A 09/21/2015   Procedure: ESOPHAGOGASTRODUODENOSCOPY (EGD) WITH PROPOFOL;  Surgeon: Josefine Class, MD;  Location: [facility] ENDOSCOPY;  Service: Endoscopy;  Laterality: N/A;    Family History No bleeding disorders, clotting disorders, aneurysms, or autoimmune diseases  Social History Social History        Tobacco Use   Smoking status: Current Every Day Smoker    Packs/day: 0.50    Types: Cigarettes   Smokeless  tobacco: Never Used  Substance Use Topics   Alcohol use: No   Drug use: No         Allergies  Allergen Reactions   Duloxetine    Morphine And Related Nausea Only   Topiramate Rash          Current Outpatient Medications  Medication Sig Dispense Refill   amLODipine (NORVASC) 10 MG tablet Take 10 mg by mouth daily.     aspirin 81 MG chewable tablet Chew by mouth daily.     clopidogrel (PLAVIX) 75 MG tablet Take 75 mg by mouth daily.     fenofibrate (TRICOR) 145 MG tablet Take 145 mg by mouth daily.     metoprolol succinate (TOPROL-XL) 25 MG 24 hr tablet Take 12.5 mg by mouth daily.      omeprazole (PRILOSEC) 40 MG capsule Take 40 mg by mouth daily.     pregabalin (LYRICA) 150 MG capsule Take 150 mg by mouth 2 (two) times daily.     atorvastatin (LIPITOR) 40 MG tablet Take 40 mg by mouth daily.     diclofenac sodium (VOLTAREN) 1 % GEL Apply topically 4 (four) times daily.     gemfibrozil (LOPID) 600 MG tablet Take 600 mg by mouth 2 (two) times daily before a meal.     guaiFENesin-codeine (CHERATUSSIN AC) 100-10 MG/5ML syrup Take 5 mLs by mouth 3 (three) times daily as needed for cough. (Patient not taking: Reported on 04/05/2019) 120 mL 0  lisinopril (PRINIVIL,ZESTRIL) 20 MG tablet Take 20 mg by mouth daily.     nortriptyline (PAMELOR) 25 MG capsule Take 25 mg by mouth at bedtime.     predniSONE (DELTASONE) 10 MG tablet Take 4 tablets once a day for 4 days (Patient not taking: Reported on 04/05/2019) 16 tablet 0   topiramate (TOPAMAX SPRINKLE) 25 MG capsule Take 25 mg by mouth 2 (two) times daily.     No current facility-administered medications for this visit.       REVIEW OF SYSTEMS (Negative unless checked)  Constitutional: [] ?Weight loss  [] ?Fever  [] ?Chills Cardiac: [] ?Chest pain   [] ?Chest pressure   [] ?Palpitations   [] ?Shortness of breath when laying flat   [] ?Shortness of breath at rest   [] ?Shortness of breath with  exertion. Vascular:  [] ?Pain in legs with walking   [] ?Pain in legs at rest   [] ?Pain in legs when laying flat   [] ?Claudication   [] ?Pain in feet when walking  [] ?Pain in feet at rest  [] ?Pain in feet when laying flat   [] ?History of DVT   [] ?Phlebitis   [] ?Swelling in legs   [] ?Varicose veins   [] ?Non-healing ulcers Pulmonary:   [] ?Uses home oxygen   [] ?Productive cough   [] ?Hemoptysis   [] ?Wheeze  [] ?COPD   [] ?Asthma Neurologic:  [] ?Dizziness  [] ?Blackouts   [] ?Seizures   [] ?History of stroke   [x] ?History of TIA  [] ?Aphasia   [x] ?Temporary blindness   [] ?Dysphagia   [] ?Weakness or numbness in arms   [] ?Weakness or numbness in legs Musculoskeletal:  [x] ?Arthritis   [] ?Joint swelling   [] ?Joint pain   [] ?Low back pain Hematologic:  [x] ?Easy bruising  [] ?Easy bleeding   [] ?Hypercoagulable state   [] ?Anemic  [] ?Hepatitis Gastrointestinal:  [] ?Blood in stool   [] ?Vomiting blood  [x] ?Gastroesophageal reflux/heartburn   [] ?Abdominal pain Genitourinary:  [] ?Chronic kidney disease   [] ?Difficult urination  [] ?Frequent urination  [] ?Burning with urination   [] ?Hematuria Skin:  [] ?Rashes   [] ?Ulcers   [] ?Wounds Psychological:  [x] ?History of anxiety   [] ? History of major depression.   Physical Examination  BP (!) 177/86    Pulse 90    Resp 17    Wt 196 lb (88.9 kg)    BMI 30.24 kg/m  Gen:  WD/WN, NAD Head: [place]/AT, No temporalis wasting. Ear/Nose/Throat: Hearing grossly intact, nares w/o erythema or drainage Eyes: Conjunctiva clear. Sclera non-icteric Neck: Supple.  Trachea midline Pulmonary:  Good air movement, no use of accessory muscles.  Cardiac: RRR, no JVD Vascular:  Vessel Right Left  Radial Palpable Palpable                   Musculoskeletal: M/S 5/5 throughout.  No deformity or atrophy.  Mild lower extremity edema. Neurologic: Sensation grossly intact in extremities.  Symmetrical.  Speech is fluent.  Psychiatric: Judgment intact, Mood & affect appropriate for pt's clinical  situation. Dermatologic: No rashes or ulcers noted.  No cellulitis or open wounds.       Labs Recent Results (from the past 2160 hour(s))  Hemoglobin     Status: None   Collection Time: 03/27/19  2:34 PM  Result Value Ref Range   Hemoglobin 13.7 13.0 - 17.0 g/dL    Comment: Performed at [facility], [address] [place],  [address]  Lipid panel     Status: Abnormal   Collection Time: 03/27/19  2:34 PM  Result Value Ref Range   Cholesterol 204 (H) 0 - 200 mg/dL   Triglycerides 430 (H) <  150 mg/dL   HDL 26 (L) >40 mg/dL   Total CHOL/HDL Ratio 7.8 RATIO   VLDL UNABLE TO CALCULATE IF TRIGLYCERIDE OVER 400 mg/dL 0 - 40 mg/dL   LDL Cholesterol UNABLE TO CALCULATE IF TRIGLYCERIDE OVER 400 mg/dL 0 - 99 mg/dL    Comment:        Total Cholesterol/HDL:CHD Risk Coronary Heart Disease Risk Table                     Men   Women  1/2 Average Risk   3.4   3.3  Average Risk       5.0   4.4  2 X Average Risk   9.6   7.1  3 X Average Risk  23.4   11.0        Use the calculated Patient Ratio above and the CHD Risk Table to determine the patient's CHD Risk.        ATP III CLASSIFICATION (LDL):  <100     mg/dL   Optimal  100-129  mg/dL   Near or Above                    Optimal  130-159  mg/dL   Borderline  160-189  mg/dL   High  >190     mg/dL   Very High Performed at [facility], [address] [place], [place] [address]   LDL cholesterol, direct     Status: Abnormal   Collection Time: 03/27/19  2:34 PM  Result Value Ref Range   Direct LDL 120.0 (H) 0 - 99 mg/dL    Comment: Performed at [facility] [address], [place], [place] [address]  I-STAT creatinine     Status: Abnormal   Collection Time: 04/25/19  3:40 PM  Result Value Ref Range   Creatinine, Ser 1.40 (H) 0.61 - 1.24 mg/dL    Radiology Ct Angio Neck W/cm &/or Wo/cm  Result Date: 04/26/2019 CLINICAL DATA:  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 Transient right arm  numbness. Abnormal carotid ultrasound, worse on the left. EXAM: CT ANGIOGRAPHY NECK TECHNIQUE: Multidetector CT imaging of the neck was performed using the standard protocol during bolus administration of intravenous contrast. Multiplanar CT image reconstructions and MIPs were obtained to evaluate the vascular anatomy. Carotid stenosis measurements (when applicable) are obtained utilizing NASCET criteria, using the distal internal carotid diameter as the denominator. CONTRAST:  17mL OMNIPAQUE IOHEXOL 350 MG/ML SOLN COMPARISON:  Ultrasound 04/02/2019 FINDINGS: Aortic arch: Aortic atherosclerosis with soft and calcified plaque. No focal aneurysm. Mild ectasia. Maximal diameter of ascending and proximal descending aorta 3 cm. Right carotid system: Common origin of the innominate artery and left common carotid artery without stenosis. Right common carotid artery widely patent to the bifurcation. Soft and calcified plaque at the carotid bifurcation and ICA bulb. Minimal diameter in the ICA bulb is 2.3 mm. Compared to a more distal cervical ICA diameter of 6.4 mm, this indicates a 65% stenosis. The upper cervical ICA also shows soft and calcified plaque narrowing of the diameter to 4 mm in that location. Left carotid system: Left common carotid artery origin is widely patent. Common carotid artery shows some atherosclerotic intimal thickening but no stenosis proximal to the bifurcation. At the carotid bifurcation and ICA bulb, there is advanced atherosclerotic disease with calcified and primarily soft plaque. Minimal diameter in the bulb is 1 mm. There is irregularity and ulceration also in the region of the soft  plaque. Compared to a more distal cervical ICA diameter of 5 mm, this indicates an 80% stenosis, but this actually may be more severe due to some flow restriction. The scan does go to the circle-of-Willis region. There is advanced atherosclerotic disease in both carotid siphon regions with narrowing and  irregularity, worse on the right than on the left. 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 See below. Vertebral arteries: Right vertebral artery origin is widely patent. No subclavian stenosis proximal to that. The right vertebral artery shows mild atherosclerotic irregularity but no flow limiting stenosis and is patent through the cervical region to the foramen magnum. There is soft and calcified plaque of the left subclavian artery origin and proximal several cm of the left subclavian artery, but no significant stenosis. There is stenosis of the left vertebral artery origin estimated at 50%. The left vertebral artery is a small vessel which shows some additional areas of mild narrowing, but is patent through the cervical region to the foramen magnum. Both vertebral arteries are patent as small vessels to the basilar artery. The basilar artery is very small but does show flow. 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 Skeleton: Ordinary cervical spondylosis. Other neck: No mass or lymphadenopathy. Upper chest: Mild emphysema pulmonary scarring. IMPRESSION: Aortic atherosclerosis. Atherosclerotic disease at the right carotid bifurcation and ICA bulb. Minimal diameter is 2.3 mm, consistent with 65% stenosis when compared to a more distal cervical ICA diameter 6.4 mm. Additional atherosclerotic disease of the ICA just beneath the skull base, with luminal narrowing to 4 mm. Advanced atherosclerotic disease in the right carotid siphon region with stenosis estimated at 50-70%. 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 Atherosclerotic disease at the left carotid bifurcation and ICA bulb. Minimal diameter the ICA bulb is 1 mm, consistent with 80% or  greater stenosis. There is also some ulceration of the soft plaque. Atherosclerotic disease in the left carotid siphon region with stenosis estimated at 50-70%. Small vertebral arteries because of the persistent right trigeminal artery. The vessels do remain patent to the foramen magnum and give supply to the posterior inferior cerebellar arteries and the small proximal basilar. Electronically Signed   By: Nelson Chimes M.D.   On: 04/26/2019 08:32    Assessment/Plan Essential hypertension blood pressure control important in reducing the progression of atherosclerotic disease. On appropriate oral medications.   Hyperlipidemia lipid control important in reducing the progression of atherosclerotic disease. Continue statin therapy  Carotid stenosis I have independently reviewed the patient's CT angiogram.  He has by the radiologist report a 50 to 70% right ICA stenosis and a greater than 80% left ICA stenosis.  I would actually interpret his study significantly worse with a 70% right ICA stenosis and a 90% left ICA stenosis based off of the measurements at the level of the disease.  He is symptomatic on the left side with TIA symptoms in the right arm as well as a visual field deficit from an embolic event in the left eye.  His left carotid artery clearly needs to be fixed.  His anatomy is reasonable for carotid stenting but the area is ulcerated and there would be a slightly higher risk of stroke.  If his cardiac evaluation is acceptable for open surgical therapy, I would plan a left carotid  endarterectomy.  If his cardiac evaluation is worrisome, carotid stenting could certainly be performed safely with a roughly 2 to 4% complication rate.  I have discussed the reason and rationale that this should be fixed.  I discussed the high rate of stroke if left alone and treated medically.  His right side does not require immediate surgery or stenting, but close follow-up will be necessary.  His hospital  ultrasound greatly underrepresented the degree of stenosis on the right.  At this point, the patient is agreeable to proceed with left carotid endarterectomy or stenting if his cardiac work-up precludes surgery    Leotis Pain, MD  05/07/2019 11:49 AM    This note was created with Dragon medical transcription system.  Any errors from dictation are purely unintentional

## 2019-05-07 NOTE — Assessment & Plan Note (Signed)
I have independently reviewed the patient's CT angiogram.  He has by the radiologist report a 50 to 70% right ICA stenosis and a greater than 80% left ICA stenosis.  I would actually interpret his study significantly worse with a 70% right ICA stenosis and a 90% left ICA stenosis based off of the measurements at the level of the disease.  He is symptomatic on the left side with TIA symptoms in the right arm as well as a visual field deficit from an embolic event in the left eye.  His left carotid artery clearly needs to be fixed.  His anatomy is reasonable for carotid stenting but the area is ulcerated and there would be a slightly higher risk of stroke.  If his cardiac evaluation is acceptable for open surgical therapy, I would plan a left carotid endarterectomy.  If his cardiac evaluation is worrisome, carotid stenting could certainly be performed safely with a roughly 2 to 4% complication rate.  I have discussed the reason and rationale that this should be fixed.  I discussed the high rate of stroke if left alone and treated medically.  His right side does not require immediate surgery or stenting, but close follow-up will be necessary.  His hospital ultrasound greatly underrepresented the degree of stenosis on the right.  At this point, the patient is agreeable to proceed with left carotid endarterectomy or stenting if his cardiac work-up precludes surgery

## 2019-05-08 ENCOUNTER — Telehealth (INDEPENDENT_AMBULATORY_CARE_PROVIDER_SITE_OTHER): Payer: Self-pay

## 2019-05-08 NOTE — Telephone Encounter (Signed)
Spoke with the patient, he now has an appt with Dr. Nehemiah Massed at [facility] for cardiac clearance. After cardiac clearance is obtained he will be scheduled for left CEA surgery.

## 2019-05-20 DIAGNOSIS — R0602 Shortness of breath: Secondary | ICD-10-CM | POA: Insufficient documentation

## 2019-05-21 ENCOUNTER — Other Ambulatory Visit: Payer: Self-pay | Admitting: Internal Medicine

## 2019-05-21 DIAGNOSIS — R0602 Shortness of breath: Secondary | ICD-10-CM

## 2019-05-22 NOTE — Telephone Encounter (Signed)
Spoke [place] with [facility] and the patient completed his NP visit and was to be scheduled for a Echo and stress test but he apparently left without scheduling those appt. Per Gwinda Passe until the patient does these appt he will not be cleared for surgery.

## 2019-05-28 ENCOUNTER — Ambulatory Visit
Admission: RE | Admit: 2019-05-28 | Discharge: 2019-05-28 | Disposition: A | Discharge status: Home / Self Care | Payer: Medicaid Other | Source: Ambulatory Visit | Attending: Internal Medicine | Admitting: Internal Medicine

## 2019-05-28 ENCOUNTER — Encounter
Admission: RE | Admit: 2019-05-28 | Discharge: 2019-05-28 | Disposition: A | Discharge status: Home / Self Care | Payer: Medicaid Other | Source: Ambulatory Visit | Attending: Internal Medicine | Admitting: Internal Medicine

## 2019-05-28 ENCOUNTER — Other Ambulatory Visit: Payer: Self-pay

## 2019-05-28 DIAGNOSIS — E785 Hyperlipidemia, unspecified: Secondary | ICD-10-CM | POA: Insufficient documentation

## 2019-05-28 DIAGNOSIS — R0602 Shortness of breath: Secondary | ICD-10-CM | POA: Diagnosis present

## 2019-05-28 DIAGNOSIS — I10 Essential (primary) hypertension: Secondary | ICD-10-CM | POA: Diagnosis not present

## 2019-05-28 DIAGNOSIS — I358 Other nonrheumatic aortic valve disorders: Secondary | ICD-10-CM | POA: Diagnosis not present

## 2019-05-28 LAB — NM MYOCAR MULTI W/SPECT W/WALL MOTION / EF
Estimated workload: 1 METS
Exercise duration (min): 1 min
Exercise duration (sec): 0 s
LV dias vol: 84 mL (ref 62–150)
LV sys vol: 22 mL
MPHR: 165 {beats}/min
Peak HR: 94 {beats}/min
Percent HR: 56 %
Rest HR: 58 {beats}/min
SDS: 0
SRS: 2
SSS: 0
TID: 0.87

## 2019-05-28 MED ORDER — REGADENOSON 0.4 MG/5ML IV SOLN
0.4000 mg | Freq: Once | INTRAVENOUS | Status: AC
  Administered 2019-05-28: 09:00:00 0.4 mg via INTRAVENOUS
  Filled 2019-05-28: qty 5

## 2019-05-28 MED ORDER — TECHNETIUM TC 99M TETROFOSMIN IV KIT
10.0000 | PACK | Freq: Once | INTRAVENOUS | Status: AC | PRN
  Administered 2019-05-28: 08:00:00 10.9 via INTRAVENOUS

## 2019-05-28 MED ORDER — TECHNETIUM TC 99M TETROFOSMIN IV KIT
30.0000 | PACK | Freq: Once | INTRAVENOUS | Status: AC | PRN
  Administered 2019-05-28: 09:00:00 32.388 via INTRAVENOUS

## 2019-05-28 NOTE — Progress Notes (Signed)
*  PRELIMINARY RESULTS* Echocardiogram 2D Echocardiogram has been performed.  Ricky King 05/28/2019, 10:16 AM

## 2019-06-04 DIAGNOSIS — I6523 Occlusion and stenosis of bilateral carotid arteries: Secondary | ICD-10-CM | POA: Insufficient documentation

## 2019-06-06 ENCOUNTER — Telehealth (INDEPENDENT_AMBULATORY_CARE_PROVIDER_SITE_OTHER): Payer: Self-pay

## 2019-06-06 NOTE — Telephone Encounter (Signed)
I attempted to contact the patient to schedule him for surgery with Dr. Lucky Cowboy. A message was left for a return call.

## 2019-06-12 ENCOUNTER — Encounter (INDEPENDENT_AMBULATORY_CARE_PROVIDER_SITE_OTHER): Payer: Self-pay

## 2019-06-12 NOTE — Telephone Encounter (Signed)
I attempted to contact the patient to scheduled surgery, a message was left for a return call.

## 2019-06-12 NOTE — Telephone Encounter (Signed)
Patient returned my call and is now scheduled with Dr. Lucky Cowboy for his surgery on 07/04/2019. Patient will do his pre-op on 06/27/2019 @ 10:15 am and his Covid testing on 07/02/2019 between 12:30-2:30 pm at the [facility]. This information will be mailed out to the patient.

## 2019-06-26 ENCOUNTER — Other Ambulatory Visit (INDEPENDENT_AMBULATORY_CARE_PROVIDER_SITE_OTHER): Payer: Self-pay | Admitting: Nurse Practitioner

## 2019-06-27 ENCOUNTER — Other Ambulatory Visit: Payer: Self-pay

## 2019-06-27 ENCOUNTER — Encounter
Admission: RE | Admit: 2019-06-27 | Discharge: 2019-06-27 | Disposition: A | Discharge status: Home / Self Care | Payer: Medicaid Other | Source: Ambulatory Visit | Attending: Vascular Surgery | Admitting: Vascular Surgery

## 2019-06-27 DIAGNOSIS — Z01812 Encounter for preprocedural laboratory examination: Secondary | ICD-10-CM | POA: Diagnosis present

## 2019-06-27 LAB — BASIC METABOLIC PANEL
Anion gap: 9 (ref 5–15)
BUN: 23 mg/dL — ABNORMAL HIGH (ref 6–20)
CO2: 26 mmol/L (ref 22–32)
Calcium: 9.5 mg/dL (ref 8.9–10.3)
Chloride: 106 mmol/L (ref 98–111)
Creatinine, Ser: 1.36 mg/dL — ABNORMAL HIGH (ref 0.61–1.24)
GFR calc Af Amer: >60 mL/min (ref >60)
GFR calc non Af Amer: 58 mL/min — ABNORMAL LOW (ref >60)
Glucose, Bld: 112 mg/dL — ABNORMAL HIGH (ref 70–99)
Potassium: 3.8 mmol/L (ref 3.5–5.1)
Sodium: 141 mmol/L (ref 135–145)

## 2019-06-27 LAB — PROTIME-INR
INR: 1 (ref 0.8–1.2)
Prothrombin Time: 13.5 seconds (ref 11.4–15.2)

## 2019-06-27 LAB — TYPE AND SCREEN
ABO/RH(D): O POS
Antibody Screen: NEGATIVE

## 2019-06-27 LAB — CBC WITH DIFFERENTIAL/PLATELET
Abs Immature Granulocytes: 0.01 10*3/uL (ref 0.00–0.07)
Basophils Absolute: 0 10*3/uL (ref 0.0–0.1)
Basophils Relative: 0 %
Eosinophils Absolute: 0.3 10*3/uL (ref 0.0–0.5)
Eosinophils Relative: 6 %
HCT: 42.3 % (ref 39.0–52.0)
Hemoglobin: 13.3 g/dL (ref 13.0–17.0)
Immature Granulocytes: 0 %
Lymphocytes Relative: 33 %
Lymphs Abs: 1.5 10*3/uL (ref 0.7–4.0)
MCH: 27.9 pg (ref 26.0–34.0)
MCHC: 31.4 g/dL (ref 30.0–36.0)
MCV: 88.9 fL (ref 80.0–100.0)
Monocytes Absolute: 0.6 10*3/uL (ref 0.1–1.0)
Monocytes Relative: 14 %
Neutro Abs: 2.1 10*3/uL (ref 1.7–7.7)
Neutrophils Relative %: 47 %
Platelets: 238 10*3/uL (ref 150–400)
RBC: 4.76 MIL/uL (ref 4.22–5.81)
RDW: 12.6 % (ref 11.5–15.5)
WBC: 4.6 10*3/uL (ref 4.0–10.5)
nRBC: 0 % (ref 0.0–0.2)

## 2019-06-27 LAB — APTT: aPTT: 30 seconds (ref 24–36)

## 2019-06-27 LAB — SURGICAL PCR SCREEN
MRSA, PCR: NEGATIVE
Staphylococcus aureus: NEGATIVE

## 2019-06-27 NOTE — Patient Instructions (Signed)
Your procedure is scheduled on: Thurs. 9/10 Report to Day Surgery.  [facility] To find out your arrival time please call [phone] between 1PM - 3PM on  Wed. 9/9.  Remember: Instructions that are not followed completely may result in serious medical risk,  up to and including death, or upon the discretion of your surgeon and anesthesiologist your  surgery may need to be rescheduled.     _X__ 1. Do not eat food after midnight the night before your procedure.                 No gum chewing or hard candies. You may drink clear liquids up to 2 hours                 before you are scheduled to arrive for your surgery- DO not drink clear                 liquids within 2 hours of the start of your surgery.                 Clear Liquids include:  water, apple juice without pulp, clear carbohydrate                 drink such as Clearfast of Gatorade, Black Coffee or Tea (Do not add                 anything to coffee or tea).  __X__2.  On the morning of surgery brush your teeth with toothpaste and water, you                may rinse your mouth with mouthwash if you wish.  Do not swallow any toothpaste of mouthwash.     _X__ 3.  No Alcohol for 24 hours before or after surgery.   _X__ 4.  Do Not Smoke or use e-cigarettes For 24 Hours Prior to Your Surgery.                 Do not use any chewable tobacco products for at least 6 hours prior to                 surgery.  ____  5.  Bring all medications with you on the day of surgery if instructed.   __x__  6.  Notify your doctor if there is any change in your medical condition      (cold, fever, infections).     Do not wear jewelry, make-up, hairpins, clips or nail polish. Do not wear lotions, powders, or perfumes. You may wear deodorant. Do not shave 48 hours prior to surgery. Men may shave face and neck. Do not bring valuables to the hospital.    [facility] is not responsible for any belongings or  valuables.  Contacts, dentures or bridgework may not be worn into surgery. Leave your suitcase in the car. After surgery it may be brought to your room. For patients admitted to the hospital, discharge time is determined by your treatment team.   Patients discharged the day of surgery will not be allowed to drive home.   Please read over the following fact sheets that you were given:    __x__ Take these medicines the morning of surgery with A SIP OF WATER:    1. amLODipine (NORVASC) 10 MG tablet  2. fenofibrate (TRICOR) 145 MG tablet  3. metoprolol succinate (TOPROL-XL) 25 MG 24 hr tablet  4.omeprazole (PRILOSEC) 40 MG capsule the night before  and morning of the surgery  5.  6.  ____ Fleet Enema (as directed)   __x__ Use CHG Soap as directed  ____ Use inhalers on the day of surgery  ____ Stop metformin 2 days prior to surgery    ____ Take 1/2 of usual insulin dose the night before surgery. No insulin the morning          of surgery.   __x__ Stop clopidogrel (PLAVIX) 75 MG tablet today and continue aspirin    ____ Stop Anti-inflammatories on    ____ Stop supplements until after surgery.    ____ Bring C-Pap to the hospital.

## 2019-07-02 ENCOUNTER — Other Ambulatory Visit
Admission: RE | Admit: 2019-07-02 | Discharge: 2019-07-02 | Disposition: A | Discharge status: Home / Self Care | Payer: Medicaid Other | Source: Ambulatory Visit | Attending: Vascular Surgery | Admitting: Vascular Surgery

## 2019-07-02 ENCOUNTER — Other Ambulatory Visit: Payer: Self-pay

## 2019-07-02 DIAGNOSIS — Z01812 Encounter for preprocedural laboratory examination: Secondary | ICD-10-CM | POA: Diagnosis not present

## 2019-07-02 DIAGNOSIS — Z20828 Contact with and (suspected) exposure to other viral communicable diseases: Secondary | ICD-10-CM | POA: Diagnosis present

## 2019-07-02 LAB — SARS CORONAVIRUS 2 (TAT 6-24 HRS): SARS Coronavirus 2: NEGATIVE

## 2019-07-03 ENCOUNTER — Encounter: Payer: Self-pay | Admitting: Registered Nurse

## 2019-07-04 ENCOUNTER — Ambulatory Visit
Admission: RE | Admit: 2019-07-04 | Discharge: 2019-07-04 | Disposition: A | Discharge status: Home / Self Care | Payer: Medicaid Other | Attending: Vascular Surgery | Admitting: Vascular Surgery

## 2019-07-04 ENCOUNTER — Encounter
Admission: RE | Disposition: A | Discharge status: Home / Self Care | Payer: Self-pay | Source: Home / Self Care | Attending: Vascular Surgery

## 2019-07-04 ENCOUNTER — Encounter: Payer: Self-pay | Admitting: *Deleted

## 2019-07-04 ENCOUNTER — Other Ambulatory Visit: Payer: Self-pay

## 2019-07-04 DIAGNOSIS — I6523 Occlusion and stenosis of bilateral carotid arteries: Secondary | ICD-10-CM | POA: Diagnosis not present

## 2019-07-04 DIAGNOSIS — M797 Fibromyalgia: Secondary | ICD-10-CM | POA: Insufficient documentation

## 2019-07-04 DIAGNOSIS — F17210 Nicotine dependence, cigarettes, uncomplicated: Secondary | ICD-10-CM | POA: Insufficient documentation

## 2019-07-04 DIAGNOSIS — Z79899 Other long term (current) drug therapy: Secondary | ICD-10-CM | POA: Diagnosis not present

## 2019-07-04 DIAGNOSIS — I739 Peripheral vascular disease, unspecified: Secondary | ICD-10-CM | POA: Diagnosis not present

## 2019-07-04 DIAGNOSIS — E782 Mixed hyperlipidemia: Secondary | ICD-10-CM | POA: Diagnosis not present

## 2019-07-04 DIAGNOSIS — B182 Chronic viral hepatitis C: Secondary | ICD-10-CM | POA: Insufficient documentation

## 2019-07-04 DIAGNOSIS — I10 Essential (primary) hypertension: Secondary | ICD-10-CM | POA: Diagnosis not present

## 2019-07-04 DIAGNOSIS — K219 Gastro-esophageal reflux disease without esophagitis: Secondary | ICD-10-CM | POA: Insufficient documentation

## 2019-07-04 DIAGNOSIS — Z01812 Encounter for preprocedural laboratory examination: Secondary | ICD-10-CM | POA: Insufficient documentation

## 2019-07-04 LAB — URINE DRUG SCREEN, QUALITATIVE (ARMC ONLY)
Amphetamines, Ur Screen: NOT DETECTED
Barbiturates, Ur Screen: NOT DETECTED
Benzodiazepine, Ur Scrn: NOT DETECTED
Cannabinoid 50 Ng, Ur ~~LOC~~: POSITIVE — AB
Cocaine Metabolite,Ur ~~LOC~~: NOT DETECTED
MDMA (Ecstasy)Ur Screen: POSITIVE — AB
Methadone Scn, Ur: NOT DETECTED
Opiate, Ur Screen: NOT DETECTED
Phencyclidine (PCP) Ur S: NOT DETECTED
Tricyclic, Ur Screen: NOT DETECTED

## 2019-07-04 SURGERY — ENDARTERECTOMY, CAROTID
Anesthesia: General | Laterality: Left

## 2019-07-04 MED ORDER — SODIUM CHLORIDE FLUSH 0.9 % IV SOLN
INTRAVENOUS | Status: AC
  Filled 2019-07-04: qty 20

## 2019-07-04 MED ORDER — LACTATED RINGERS IV SOLN: INTRAVENOUS | Status: DC

## 2019-07-04 MED ORDER — NITROGLYCERIN IN D5W 200-5 MCG/ML-% IV SOLN
INTRAVENOUS | Status: AC
  Filled 2019-07-04: qty 250

## 2019-07-04 MED ORDER — ONDANSETRON HCL 4 MG/2ML IJ SOLN: 4.0000 mg | Freq: Four times a day (QID) | INTRAMUSCULAR | Status: DC | PRN

## 2019-07-04 MED ORDER — CEFAZOLIN SODIUM-DEXTROSE 2-4 GM/100ML-% IV SOLN: 2.0000 g | INTRAVENOUS | Status: DC

## 2019-07-04 MED ORDER — CHLORHEXIDINE GLUCONATE CLOTH 2 % EX PADS: 6.0000 | MEDICATED_PAD | Freq: Once | CUTANEOUS | Status: DC

## 2019-07-04 MED ORDER — HYDROMORPHONE HCL 1 MG/ML IJ SOLN: 1.0000 mg | Freq: Once | INTRAMUSCULAR | Status: DC | PRN

## 2019-07-04 MED ORDER — HEPARIN SODIUM (PORCINE) 1000 UNIT/ML IJ SOLN
INTRAMUSCULAR | Status: AC
  Filled 2019-07-04: qty 1

## 2019-07-04 MED ORDER — CEFAZOLIN SODIUM-DEXTROSE 2-4 GM/100ML-% IV SOLN
INTRAVENOUS | Status: AC
  Filled 2019-07-04: qty 100

## 2019-07-04 MED ORDER — LIDOCAINE HCL (PF) 1 % IJ SOLN
INTRAMUSCULAR | Status: AC
  Filled 2019-07-04: qty 30

## 2019-07-04 SURGICAL SUPPLY — 57 items
BAG DECANTER FOR FLEXI CONT (MISCELLANEOUS) ×2 IMPLANT
BLADE SURG 15 STRL LF DISP TIS (BLADE) ×1 IMPLANT
BLADE SURG 15 STRL SS (BLADE) ×1
BLADE SURG SZ11 CARB STEEL (BLADE) ×2 IMPLANT
BOOT SUTURE AID YELLOW STND (SUTURE) ×2 IMPLANT
BRUSH SCRUB EZ  4% CHG (MISCELLANEOUS) ×1
BRUSH SCRUB EZ 4% CHG (MISCELLANEOUS) ×1 IMPLANT
CANISTER SUCT 1200ML W/VALVE (MISCELLANEOUS) ×2 IMPLANT
CHLORAPREP W/TINT 26ML (MISCELLANEOUS) ×2 IMPLANT
COVER WAND RF STERILE (DRAPES) ×2 IMPLANT
DERMABOND ADVANCED (GAUZE/BANDAGES/DRESSINGS) ×1
DERMABOND ADVANCED .7 DNX12 (GAUZE/BANDAGES/DRESSINGS) ×1 IMPLANT
DRAPE 3/4 80X56 (DRAPES) ×2 IMPLANT
DRAPE INCISE IOBAN 66X45 STRL (DRAPES) ×2 IMPLANT
DRAPE LAPAROTOMY 77X122 PED (DRAPES) ×2 IMPLANT
ELECT CAUTERY BLADE 6.4 (BLADE) ×2 IMPLANT
ELECT REM PT RETURN 9FT ADLT (ELECTROSURGICAL) ×2
ELECTRODE REM PT RTRN 9FT ADLT (ELECTROSURGICAL) ×1 IMPLANT
GLOVE BIO SURGEON STRL SZ7 (GLOVE) ×6 IMPLANT
GLOVE INDICATOR 7.5 STRL GRN (GLOVE) ×2 IMPLANT
GOWN STRL REUS W/ TWL LRG LVL3 (GOWN DISPOSABLE) ×2 IMPLANT
GOWN STRL REUS W/ TWL XL LVL3 (GOWN DISPOSABLE) ×2 IMPLANT
GOWN STRL REUS W/TWL LRG LVL3 (GOWN DISPOSABLE) ×2
GOWN STRL REUS W/TWL XL LVL3 (GOWN DISPOSABLE) ×2
HEMOSTAT SURGICEL 2X3 (HEMOSTASIS) ×2 IMPLANT
IV NS 250ML (IV SOLUTION) ×1
IV NS 250ML BAXH (IV SOLUTION) ×1 IMPLANT
KIT TURNOVER KIT A (KITS) ×2 IMPLANT
LABEL OR SOLS (LABEL) ×2 IMPLANT
LOOP RED MAXI  1X406MM (MISCELLANEOUS) ×2
LOOP VESSEL MAXI 1X406 RED (MISCELLANEOUS) ×2 IMPLANT
LOOP VESSEL MINI 0.8X406 BLUE (MISCELLANEOUS) ×1 IMPLANT
LOOPS BLUE MINI 0.8X406MM (MISCELLANEOUS) ×1
NEEDLE FILTER BLUNT 18X 1/2SAF (NEEDLE) ×1
NEEDLE FILTER BLUNT 18X1 1/2 (NEEDLE) ×1 IMPLANT
NEEDLE HYPO 25X1 1.5 SAFETY (NEEDLE) ×2 IMPLANT
NS IRRIG 500ML POUR BTL (IV SOLUTION) ×2 IMPLANT
PACK BASIN MAJOR ARMC (MISCELLANEOUS) ×2 IMPLANT
PENCIL ELECTRO HAND CTR (MISCELLANEOUS) IMPLANT
SHUNT W TPORT 9FR PRUITT F3 (SHUNT) ×2 IMPLANT
SUT MNCRL 4-0 (SUTURE) ×1
SUT MNCRL 4-0 27XMFL (SUTURE) ×1
SUT PROLENE 6 0 BV (SUTURE) ×8 IMPLANT
SUT PROLENE 7 0 BV 1 (SUTURE) ×4 IMPLANT
SUT SILK 2 0 (SUTURE) ×1
SUT SILK 2-0 18XBRD TIE 12 (SUTURE) ×1 IMPLANT
SUT SILK 3 0 (SUTURE) ×1
SUT SILK 3-0 18XBRD TIE 12 (SUTURE) ×1 IMPLANT
SUT SILK 4 0 (SUTURE) ×1
SUT SILK 4-0 18XBRD TIE 12 (SUTURE) ×1 IMPLANT
SUT VIC AB 3-0 SH 27 (SUTURE) ×2
SUT VIC AB 3-0 SH 27X BRD (SUTURE) ×2 IMPLANT
SUTURE MNCRL 4-0 27XMF (SUTURE) ×1 IMPLANT
SYR 10ML LL (SYRINGE) ×4 IMPLANT
SYR 20ML LL LF (SYRINGE) ×2 IMPLANT
TRAY FOLEY MTR SLVR 16FR STAT (SET/KITS/TRAYS/PACK) ×2 IMPLANT
TUBING CONNECTING 10 (TUBING) IMPLANT

## 2019-07-04 NOTE — Anesthesia Preprocedure Evaluation (Deleted)
Anesthesia Evaluation  Patient identified by MRN, date of birth, ID band Patient awake  General Assessment Comment:Pt UDS positive for ectasy (NMDA). Discussed w patient and Dr. Lucky Cowboy. Case postponed until pt clear of drug.  Reviewed: Allergy & Precautions, H&P , NPO status , reviewed documented beta blocker date and time   Airway Mallampati: III  TM Distance: >3 FB Neck ROM: full    Dental  (+) Chipped, Missing   Pulmonary Current Smoker,    Pulmonary exam normal        Cardiovascular hypertension, + Peripheral Vascular Disease  Normal cardiovascular exam  ECHO 05/28/2019  1. The left ventricle has normal systolic function with an ejection fraction of 60-65%. The cavity size was normal. Left ventricular diastolic parameters were normal.  2. The right ventricle has normal systolic function. The cavity was normal. There is no increase in right ventricular wall thickness.  3. The mitral valve is grossly normal.  4. The aortic valve was not well visualized. Mild thickening of the aortic valve. Mild calcification of the aortic valve. Aortic valve regurgitation is trivial by color flow Doppler.  5. The aorta is normal in size and structure.  6. The interatrial septum was not assessed.  No sig change on EKG   Neuro/Psych  Neuromuscular disease    GI/Hepatic GERD  Medicated and Controlled,(+) Hepatitis -, C  Endo/Other    Renal/GU      Musculoskeletal  (+) Fibromyalgia -  Abdominal   Peds  Hematology   Anesthesia Other Findings Past Medical History: No date: Benign essential hypertension No date: Chest pain No date: Chronic hepatitis C without hepatic coma (HCC)     Comment:  treated  No date: Fibromyalgia No date: GERD (gastroesophageal reflux disease) No date: Hyperlipidemia, mixed No date: Hypertriglyceridemia No date: Platelet inhibition due to Plavix No date: PVD (peripheral vascular disease) ([place]) Past Surgical  History: 10/02/2018: COLONOSCOPY WITH PROPOFOL; N/A     Comment:  Procedure: COLONOSCOPY WITH PROPOFOL;  Surgeon: Jonathon Bellows, MD;  Location: [facility] ENDOSCOPY;  Service:               Gastroenterology;  Laterality: N/A; 09/21/2015: ESOPHAGOGASTRODUODENOSCOPY (EGD) WITH PROPOFOL; N/A     Comment:  Procedure: ESOPHAGOGASTRODUODENOSCOPY (EGD) WITH               PROPOFOL;  Surgeon: Josefine Class, MD;  Location:               [facility] ENDOSCOPY;  Service: Endoscopy;  Laterality: N/A; No date: stent lower leg   Reproductive/Obstetrics                           Anesthesia Physical Anesthesia Plan  ASA: III  Anesthesia Plan: General   Post-op Pain Management:    Induction: Intravenous  PONV Risk Score and Plan: Ondansetron, Treatment may vary due to age or medical condition and Midazolam  Airway Management Planned: Oral ETT  Additional Equipment: Arterial line  Intra-op Plan:   Post-operative Plan: Extubation in OR  Informed Consent: I have reviewed the patients History and Physical, chart, labs and discussed the procedure including the risks, benefits and alternatives for the proposed anesthesia with the patient or authorized representative who has indicated his/her understanding and acceptance.     Dental Advisory Given  Plan Discussed with: CRNA  Anesthesia Plan Comments:  Anesthesia Quick Evaluation  

## 2019-07-04 NOTE — Progress Notes (Signed)
Patient procedure cancelled by Dr Lucky Cowboy and Dr Lavone Neri due to positive for ectasy UDS. Patient was informed of positive result and discharged.

## 2019-07-04 NOTE — H&P (Signed)
[facility] Admission History & Physical  MRN : [id number]  Ricky King is a 53 y.o. (1966-05-06) male who presents with chief complaint of No chief complaint on file. Marland Kitchen  History of Present Illness: Patient returns today in follow up of his carotid disease in preparation for his carotid endarterectomy on the left.  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  No symptoms since his last visit to our office.  No major changes or other issues in that timeframe either. I have independently reviewed the patient's CT angiogram.  He has by the radiologist report a 50 to 70% right ICA stenosis and a greater than 80% left ICA stenosis.  I would actually interpret his study significantly worse with a 70% right ICA stenosis and a 90% left ICA stenosis based off of the measurements at the level of the disease.  Current Facility-Administered Medications  Medication Dose Route Frequency Provider Last Rate Last Dose  . ceFAZolin (ANCEF) 2-4 GM/100ML-% IVPB           . ceFAZolin (ANCEF) IVPB 2g/100 mL premix  2 g Intravenous On Call to [place], Yettem, NP      . Chlorhexidine Gluconate Cloth 2 % PADS 6 each  6 each Topical Once Kris Hartmann, NP       And  . Chlorhexidine Gluconate Cloth 2 % PADS 6 each  6 each Topical Once Kris Hartmann, NP      . HYDROmorphone (DILAUDID) injection 1 mg  1 mg Intravenous Once PRN Kris Hartmann, NP      . lactated ringers infusion   Intravenous Continuous Alvin Critchley, MD      . ondansetron [facility]) injection 4 mg  4 mg Intravenous Q6H PRN Eulogio Ditch E, NP      . sodium chloride flush 0.9 % injection             Past Medical History:  Diagnosis Date  . Benign essential hypertension   . Chest pain   . Chronic hepatitis C without hepatic coma ([place])    treated   . Fibromyalgia   . GERD (gastroesophageal reflux disease)   . Hyperlipidemia, mixed   .  Hypertriglyceridemia   . Platelet inhibition due to Plavix   . PVD (peripheral vascular disease) ([place])     Past Surgical History:  Procedure Laterality Date  . COLONOSCOPY WITH PROPOFOL N/A 10/02/2018   Procedure: COLONOSCOPY WITH PROPOFOL;  Surgeon: Jonathon Bellows, MD;  Location: [facility] ENDOSCOPY;  Service: Gastroenterology;  Laterality: N/A;  . ESOPHAGOGASTRODUODENOSCOPY (EGD) WITH PROPOFOL N/A 09/21/2015   Procedure: ESOPHAGOGASTRODUODENOSCOPY (EGD) WITH PROPOFOL;  Surgeon: Josefine Class, MD;  Location: [facility] ENDOSCOPY;  Service: Endoscopy;  Laterality: N/A;  . stent lower leg      Social History Social History   Tobacco Use  . Smoking status: Current Every Day Smoker    Packs/day: 0.25    Types: Cigarettes  . Smokeless tobacco: Never Used  Substance Use Topics  . Alcohol use: No  . Drug use: Not Currently    Types: Marijuana, "Crack" cocaine    Family History No bleeding disorders, clotting disorders, autoimmune diseases or aneurysms  Allergies  Allergen Reactions  . Duloxetine Other (See Comments)    Bizarre dreams  . Morphine And Related Nausea Only  . Topiramate Rash     REVIEW OF SYSTEMS (Negative unless checked)  Constitutional: [] Weight loss  [] Fever  [] Chills Cardiac: [x] Chest pain   [] Chest pressure   [] Palpitations   [] Shortness of breath when laying flat   [] Shortness of breath at rest   [] Shortness of breath with exertion. Vascular:  [] Pain in legs with walking   [] Pain in legs at rest   [] Pain in legs when laying flat   [] Claudication   [] Pain in feet when walking  [] Pain in feet at rest  [] Pain in feet when laying flat   [] History of DVT   [] Phlebitis   [] Swelling in legs   [] Varicose veins   [] Non-healing ulcers Pulmonary:   [] Uses home oxygen   [] Productive cough   [] Hemoptysis   [] Wheeze  [] COPD   [] Asthma Neurologic:  [] Dizziness  [] Blackouts   [] Seizures   [] History of stroke   [] History of TIA  [] Aphasia   [] Temporary blindness   [] Dysphagia    [] Weakness or numbness in arms   [] Weakness or numbness in legs Musculoskeletal:  [x] Arthritis   [] Joint swelling   [] Joint pain   [] Low back pain Hematologic:  [] Easy bruising  [] Easy bleeding   [] Hypercoagulable state   [] Anemic  [] Hepatitis Gastrointestinal:  [] Blood in stool   [] Vomiting blood  [x] Gastroesophageal reflux/heartburn   [] Difficulty swallowing. Genitourinary:  [] Chronic kidney disease   [] Difficult urination  [] Frequent urination  [] Burning with urination   [] Blood in urine Skin:  [] Rashes   [] Ulcers   [] Wounds Psychological:  [] History of anxiety   []  History of major depression.  Physical Examination  There were no vitals filed for this visit. There is no height or weight on file to calculate BMI. Gen: WD/WN, NAD Head: [place]/AT, No temporalis wasting.  Ear/Nose/Throat: Hearing grossly intact, nares w/o erythema or drainage, oropharynx w/o Erythema/Exudate,  Eyes: Conjunctiva clear, sclera non-icteric Neck: Trachea midline.  No JVD.  Pulmonary:  Good air movement, respirations not labored, no use of accessory muscles.  Cardiac: RRR, normal S1, S2. Vascular:  Vessel Right Left  Radial Palpable Palpable   Musculoskeletal: M/S 5/5 throughout.  Extremities without ischemic changes.  No deformity or atrophy.  Neurologic: Sensation grossly intact in extremities.  Symmetrical.  Speech is fluent. Motor exam as listed above. Psychiatric: Judgment intact, Mood & affect appropriate for pt's clinical situation. Dermatologic: No rashes or ulcers noted.  No cellulitis or open wounds.      CBC Lab Results  Component Value Date   WBC 4.6 06/27/2019   HGB 13.3 06/27/2019   HCT 42.3 06/27/2019   MCV 88.9 06/27/2019   PLT 238 06/27/2019    BMET    Component Value Date/Time   NA 141 06/27/2019 1107   NA 139 11/18/2014 0734   K 3.8 06/27/2019 1107   K 4.3 11/18/2014 0734   CL 106 06/27/2019 1107   CL 106 11/18/2014 0734   CO2 26 06/27/2019 1107   CO2 26 11/18/2014 0734    GLUCOSE 112 (H) 06/27/2019 1107   GLUCOSE 121 (H) 11/18/2014 0734   BUN 23 (H) 06/27/2019 1107   BUN 21 (H) 11/18/2014 0734   CREATININE 1.36 (H) 06/27/2019 1107   CREATININE 2.01 (H) 11/18/2014 0734   CALCIUM 9.5 06/27/2019 1107   CALCIUM 8.5 11/18/2014 0734   GFRNONAA 58 (L) 06/27/2019 1107   GFRNONAA 38 (L) 11/18/2014 0734   GFRNONAA >60 06/17/2014 0719   GFRAA >60 06/27/2019 1107   GFRAA 46 (L) 11/18/2014 0734   GFRAA >60 06/17/2014 0719   Estimated Creatinine Clearance: 64.9 mL/min (A) (by C-G formula  based on SCr of 1.36 mg/dL (H)).  COAG Lab Results  Component Value Date   INR 1.0 06/27/2019    Radiology No results found.    Assessment/Plan 1. Bilateral CAS, L>R. For left CEA today.  Risks and benefits are discussed and he is agreeable to proceed.  Risks included bleeding, infection, cardiopulmonary complications, nerve injury, stroke, and death 2. HTN. Stable on outpatient medication and blood pressure control important in reducing the progression of atherosclerotic disease. On appropriate oral medications. 3. Hyperlipidemia. lipid control important in reducing the progression of atherosclerotic disease. Continue statin therapy    Leotis Pain, MD  07/04/2019 10:05 AM

## 2019-07-04 NOTE — Progress Notes (Signed)
UDS returned positive and surgery will have to be postponed and rescheduled to a later date.

## 2019-07-19 ENCOUNTER — Telehealth (INDEPENDENT_AMBULATORY_CARE_PROVIDER_SITE_OTHER): Payer: Self-pay

## 2019-07-19 NOTE — Telephone Encounter (Signed)
Spoke with the patient, he is now scheduled with Dr. Lucky Cowboy for surgery on 08/01/2019. Patient will do his Covid testing on 07/29/2019 between 12:30-2:30 pm at the [facility]. Patient has had his pre-op on 06/27/2019. The pre-surgical  information will be mailed out to the patient.

## 2019-07-28 ENCOUNTER — Other Ambulatory Visit (INDEPENDENT_AMBULATORY_CARE_PROVIDER_SITE_OTHER): Payer: Self-pay | Admitting: Nurse Practitioner

## 2019-07-29 ENCOUNTER — Other Ambulatory Visit
Admission: RE | Admit: 2019-07-29 | Discharge: 2019-07-29 | Disposition: A | Discharge status: Home / Self Care | Payer: Medicaid Other | Source: Ambulatory Visit | Attending: Vascular Surgery | Admitting: Vascular Surgery

## 2019-07-29 ENCOUNTER — Other Ambulatory Visit: Payer: Self-pay

## 2019-07-29 DIAGNOSIS — Z01812 Encounter for preprocedural laboratory examination: Secondary | ICD-10-CM | POA: Insufficient documentation

## 2019-07-29 DIAGNOSIS — Z20828 Contact with and (suspected) exposure to other viral communicable diseases: Secondary | ICD-10-CM | POA: Insufficient documentation

## 2019-07-29 LAB — SARS CORONAVIRUS 2 (TAT 6-24 HRS): SARS Coronavirus 2: NEGATIVE

## 2019-07-31 ENCOUNTER — Emergency Department
Admission: EM | Admit: 2019-07-31 | Discharge: 2019-07-31 | Disposition: A | Discharge status: Home / Self Care | Payer: Medicaid Other | Attending: Emergency Medicine | Admitting: Emergency Medicine

## 2019-07-31 ENCOUNTER — Emergency Department: Payer: Medicaid Other

## 2019-07-31 ENCOUNTER — Encounter: Payer: Self-pay | Admitting: Emergency Medicine

## 2019-07-31 ENCOUNTER — Other Ambulatory Visit: Payer: Self-pay

## 2019-07-31 DIAGNOSIS — J069 Acute upper respiratory infection, unspecified: Secondary | ICD-10-CM | POA: Diagnosis not present

## 2019-07-31 DIAGNOSIS — Z7982 Long term (current) use of aspirin: Secondary | ICD-10-CM | POA: Insufficient documentation

## 2019-07-31 DIAGNOSIS — Z20828 Contact with and (suspected) exposure to other viral communicable diseases: Secondary | ICD-10-CM | POA: Diagnosis not present

## 2019-07-31 DIAGNOSIS — I10 Essential (primary) hypertension: Secondary | ICD-10-CM | POA: Insufficient documentation

## 2019-07-31 DIAGNOSIS — Z79899 Other long term (current) drug therapy: Secondary | ICD-10-CM | POA: Insufficient documentation

## 2019-07-31 DIAGNOSIS — F17210 Nicotine dependence, cigarettes, uncomplicated: Secondary | ICD-10-CM | POA: Diagnosis not present

## 2019-07-31 DIAGNOSIS — R05 Cough: Secondary | ICD-10-CM | POA: Diagnosis present

## 2019-07-31 LAB — SARS CORONAVIRUS 2 BY RT PCR (HOSPITAL ORDER, PERFORMED IN ~~LOC~~ HOSPITAL LAB): SARS Coronavirus 2: NEGATIVE

## 2019-07-31 MED ORDER — GUAIFENESIN-CODEINE 100-10 MG/5ML PO SYRP: 5.0000 mL | ORAL_SOLUTION | Freq: Four times a day (QID) | ORAL | 0 refills | Status: DC | PRN

## 2019-07-31 NOTE — ED Provider Notes (Signed)
[facility] Emergency Department Provider Note   ____________________________________________   First MD Initiated Contact with Patient 07/31/19 1025     (approximate)  I have reviewed the triage vital signs and the nursing notes.   HISTORY  Chief Complaint Cough and Headache   HPI Ricky King is a 53 y.o. male presents to the ED with complaint of cough for the last 2 days.  He is unaware of any fever and denies shortness of breath.  He denies any changes in his appetite, taste or smell.  He has had no GI symptoms.  Patient states that he did not get sick until he came to the hospital to have his pre-surgical COVID test done.  He states that he has continued to have a occasional productive cough since that time.  Patient continues to smoke 7 to 8 cigarettes/day.  He denies any known exposure to COVID.  He rates his pain from coughing a 10/10.       Past Medical History:  Diagnosis Date  . Benign essential hypertension   . Chest pain   . Chronic hepatitis C without hepatic coma ([place])    treated   . Fibromyalgia   . GERD (gastroesophageal reflux disease)   . Hyperlipidemia, mixed   . Hypertriglyceridemia   . Platelet inhibition due to Plavix   . PVD (peripheral vascular disease) [facility])     Patient Active Problem List   Diagnosis Date Noted  . Essential hypertension 04/05/2019  . Hyperlipidemia 04/05/2019  . Carotid stenosis 04/05/2019    Past Surgical History:  Procedure Laterality Date  . COLONOSCOPY WITH PROPOFOL N/A 10/02/2018   Procedure: COLONOSCOPY WITH PROPOFOL;  Surgeon: Jonathon Bellows, MD;  Location: [facility] ENDOSCOPY;  Service: Gastroenterology;  Laterality: N/A;  . ESOPHAGOGASTRODUODENOSCOPY (EGD) WITH PROPOFOL N/A 09/21/2015   Procedure: ESOPHAGOGASTRODUODENOSCOPY (EGD) WITH PROPOFOL;  Surgeon: Josefine Class, MD;  Location: [facility] ENDOSCOPY;  Service: Endoscopy;  Laterality: N/A;  . stent lower leg      Prior to Admission  medications   Medication Sig Start Date End Date Taking? Authorizing Provider  amLODipine (NORVASC) 10 MG tablet Take 10 mg by mouth daily.    [provider]  aspirin 81 MG chewable tablet Chew by mouth daily.    [provider]  atorvastatin (LIPITOR) 40 MG tablet Take 40 mg by mouth daily.    [provider]  clopidogrel (PLAVIX) 75 MG tablet Take 75 mg by mouth daily.    [provider]  fenofibrate (TRICOR) 145 MG tablet Take 145 mg by mouth daily.    [provider]  guaiFENesin-codeine (CHERATUSSIN AC) 100-10 MG/5ML syrup Take 5 mLs by mouth 4 (four) times daily as needed for cough. 07/31/19   Johnn Hai, PA-C  metoprolol succinate (TOPROL-XL) 25 MG 24 hr tablet Take 12.5 mg by mouth daily.     [provider]  omeprazole (PRILOSEC) 40 MG capsule Take 40 mg by mouth daily.    [provider]  predniSONE (DELTASONE) 10 MG tablet Take 4 tablets once a day for 4 days 11/07/17   Johnn Hai, PA-C  pregabalin (LYRICA) 150 MG capsule Take 150 mg by mouth 2 (two) times daily.    [provider]    Allergies Duloxetine, Morphine and related, and Topiramate  No family history on file.  Social History Social History   Tobacco Use  . Smoking status: Current Every Day Smoker    Packs/day: 0.25    Types:  Cigarettes  . Smokeless tobacco: Never Used  Substance Use Topics  . Alcohol use: No  . Drug use: Not Currently    Types: Marijuana, "Crack" cocaine    Review of Systems Constitutional: No fever/chills Eyes: No visual changes. ENT: No sore throat.   Cardiovascular: Denies chest pain. Respiratory: Denies shortness of breath.  Positive cough. Gastrointestinal: No abdominal pain.  No nausea, no vomiting.  No diarrhea.   Musculoskeletal: Negative for muscle aches. Skin: Negative for rash. Neurological: Negative for headaches, focal weakness or numbness. ____________________________________________    PHYSICAL EXAM:  VITAL SIGNS: ED Triage Vitals  Enc Vitals Group     BP 07/31/19 1009 139/76     Pulse Rate 07/31/19 1009 91     Resp 07/31/19 1009 16     Temp 07/31/19 1009 98.3 F (36.8 C)     Temp Source 07/31/19 1009 Oral     SpO2 07/31/19 1010 96 %     Weight 07/31/19 1010 192 lb (87.1 kg)     Height 07/31/19 1010 5\' 7"  (1.702 m)     Head Circumference --      Peak Flow --      Pain Score 07/31/19 1010 10     Pain Loc --      Pain Edu? --      Excl. in [place]? --    Constitutional: Alert and oriented. Well appearing and in no acute distress. Eyes: Conjunctivae are normal.  Head: Atraumatic. Nose: No congestion/rhinnorhea.  EACs and TMs are clear. Neck: No stridor.   Cardiovascular: Normal rate, regular rhythm. Grossly normal heart sounds.  Good peripheral circulation. Respiratory: Normal respiratory effort.  No retractions. Lungs CTAB.  Frequent cough noted during exam. Gastrointestinal: Soft and nontender. No distention. Musculoskeletal: Moves upper and lower extremities without any difficulty.  Normal gait was noted. Neurologic:  Normal speech and language. No gross focal neurologic deficits are appreciated. No gait instability. Skin:  Skin is warm, dry and intact. No rash noted. Psychiatric: Mood and affect are normal. Speech and behavior are normal.  ____________________________________________   LABS (all labs ordered are listed, but only abnormal results are displayed)  Labs Reviewed  SARS CORONAVIRUS 2 ([facility])    RADIOLOGY  Official radiology report(s): Dg Chest Portable 1 View  Result Date: 07/31/2019 CLINICAL DATA:  Cough for 2 days. EXAM: PORTABLE CHEST 1 VIEW COMPARISON:  07/28/2013 FINDINGS: The cardiac silhouette, mediastinal and hilar contours are within normal limits. The lungs are clear. No pleural effusion. No pulmonary lesions. The bony thorax is intact. IMPRESSION: No acute cardiopulmonary findings.  Electronically Signed   By: Marijo Sanes M.D.   On: 07/31/2019 12:19    ____________________________________________   PROCEDURES  Procedure(s) performed (including Critical Care):  Procedures ____________________________________________   INITIAL IMPRESSION / ASSESSMENT AND PLAN / ED COURSE  As part of my medical decision making, I reviewed the following data within the electronic MEDICAL RECORD NUMBER Notes from prior ED visits and [place] Controlled Substance Database  ATTICUS XAYAVONG was evaluated in Emergency Department on 07/31/2019 for the symptoms described in the history of present illness. He was evaluated in the context of the global COVID-19 pandemic, which necessitated consideration that the patient might be at risk for infection with the SARS-CoV-2 virus that causes COVID-19. Institutional protocols and algorithms that pertain to the evaluation of patients at risk for COVID-19 are in a state of rapid change based on information released by regulatory bodies including  the [employer] and federal and state organizations. These policies and algorithms were followed during the patient's care in the ED.  53 year old male presents to the ED with complaint of cough for the last 2 days.  Patient states that he came to the hospital to have a CABG test prior to a surgical procedure.  He states that he became ill after he left the hospital and has been coughing since.  Patient does continue to smoke 7 to 8 cigarettes/day.  He is unaware of any fever, chills, nausea or vomiting.  He is unaware of any known exposure to COVID.  Chest x-ray was negative however it was noted that patient continued to cough frequently while in the exam room.  He was given a prescription for guaifenesin/codeine to take as needed for cough.  He was also encouraged to call his doctor cancel and reschedule his procedure for tomorrow.  ____________________________________________   FINAL CLINICAL IMPRESSION(S) / ED DIAGNOSES  Final  diagnoses:  Viral URI with cough     ED Discharge Orders         Ordered    guaiFENesin-codeine (CHERATUSSIN AC) 100-10 MG/5ML syrup  4 times daily PRN     07/31/19 1234           Note:  This document was prepared using Dragon voice recognition software and may include unintentional dictation errors.    Johnn Hai, PA-C 07/31/19 1338    Earleen Newport, MD 07/31/19 1420

## 2019-07-31 NOTE — ED Triage Notes (Signed)
Pt states cough for 2 days now, denies fever, no SHOB, NAD.

## 2019-07-31 NOTE — Discharge Instructions (Signed)
Call your doctor for tomorrow's procedure to let them know that you were seen in the emergency department and currently you have a viral illness with cough.  You will need to reschedule your procedure for tomorrow due to your coughing.  A prescription for Cheratussin [facility] was sent to your pharmacy.  You will need to quarantine until you have heard the results of your COVID test.  Increase fluids.  Tylenol or ibuprofen if needed for body aches, fever or headache.  Discontinue or decrease smoking.

## 2019-08-01 ENCOUNTER — Telehealth (INDEPENDENT_AMBULATORY_CARE_PROVIDER_SITE_OTHER): Payer: Self-pay

## 2019-08-01 ENCOUNTER — Encounter: Admission: RE | Payer: Self-pay | Source: Home / Self Care

## 2019-08-01 ENCOUNTER — Inpatient Hospital Stay: Admission: RE | Admit: 2019-08-01 | Payer: Medicaid Other | Source: Home / Self Care | Admitting: Vascular Surgery

## 2019-08-01 SURGERY — ENDARTERECTOMY, CAROTID
Anesthesia: General | Laterality: Left

## 2019-08-01 NOTE — Telephone Encounter (Signed)
Stegmayer, Clarene Duke, CMA  Cc: Algernon Huxley, MD        Hey,   This patient was seen in the ED today and treated for a cough and sent home with the recommendation from the ER physician to cancel this surgery (carotid tomorrow). Can we please reschedule him for next week?   Thanks.  Kim    Patient has now been rescheduled with Dr. Lucky Cowboy for 08/08/2019. Patient will do his Covid testing on 08/05/2019 between 12:30-2:30 pm at the [facility]. I attempted to contact the patient to give him his new surgery day and Covid testing day and time. I was unable to make contact so a message was left for a return call. The pre-surgical instructions will be mailed to the patient.

## 2019-08-05 ENCOUNTER — Other Ambulatory Visit: Payer: Self-pay

## 2019-08-05 ENCOUNTER — Other Ambulatory Visit
Admission: RE | Admit: 2019-08-05 | Discharge: 2019-08-05 | Disposition: A | Discharge status: Home / Self Care | Payer: Medicaid Other | Source: Ambulatory Visit | Attending: Vascular Surgery | Admitting: Vascular Surgery

## 2019-08-05 DIAGNOSIS — Z01812 Encounter for preprocedural laboratory examination: Secondary | ICD-10-CM | POA: Insufficient documentation

## 2019-08-05 DIAGNOSIS — Z20828 Contact with and (suspected) exposure to other viral communicable diseases: Secondary | ICD-10-CM | POA: Diagnosis not present

## 2019-08-06 LAB — SARS CORONAVIRUS 2 (TAT 6-24 HRS): SARS Coronavirus 2: NEGATIVE

## 2019-08-08 ENCOUNTER — Encounter: Payer: Self-pay | Admitting: Emergency Medicine

## 2019-08-08 ENCOUNTER — Encounter: Payer: Self-pay | Admitting: Certified Registered"

## 2019-08-08 ENCOUNTER — Encounter
Admission: RE | Disposition: A | Discharge status: Home / Self Care | Payer: Self-pay | Source: Home / Self Care | Attending: Vascular Surgery

## 2019-08-08 ENCOUNTER — Other Ambulatory Visit: Payer: Self-pay

## 2019-08-08 ENCOUNTER — Ambulatory Visit
Admission: RE | Admit: 2019-08-08 | Discharge: 2019-08-08 | Disposition: A | Discharge status: Home / Self Care | Payer: Medicaid Other | Attending: Vascular Surgery | Admitting: Vascular Surgery

## 2019-08-08 DIAGNOSIS — Z01818 Encounter for other preprocedural examination: Secondary | ICD-10-CM | POA: Diagnosis present

## 2019-08-08 DIAGNOSIS — I6529 Occlusion and stenosis of unspecified carotid artery: Secondary | ICD-10-CM | POA: Diagnosis not present

## 2019-08-08 DIAGNOSIS — Z5309 Procedure and treatment not carried out because of other contraindication: Secondary | ICD-10-CM | POA: Diagnosis not present

## 2019-08-08 LAB — URINE DRUG SCREEN, QUALITATIVE (ARMC ONLY)
Amphetamines, Ur Screen: NOT DETECTED
Barbiturates, Ur Screen: NOT DETECTED
Benzodiazepine, Ur Scrn: NOT DETECTED
Cannabinoid 50 Ng, Ur ~~LOC~~: POSITIVE — AB
Cocaine Metabolite,Ur ~~LOC~~: NOT DETECTED
MDMA (Ecstasy)Ur Screen: POSITIVE — AB
Methadone Scn, Ur: NOT DETECTED
Opiate, Ur Screen: NOT DETECTED
Phencyclidine (PCP) Ur S: NOT DETECTED
Tricyclic, Ur Screen: NOT DETECTED

## 2019-08-08 LAB — TYPE AND SCREEN
ABO/RH(D): O POS
Antibody Screen: NEGATIVE

## 2019-08-08 LAB — CBC WITH DIFFERENTIAL/PLATELET
Abs Immature Granulocytes: 0.02 10*3/uL (ref 0.00–0.07)
Basophils Absolute: 0.1 10*3/uL (ref 0.0–0.1)
Basophils Relative: 1 %
Eosinophils Absolute: 0.3 10*3/uL (ref 0.0–0.5)
Eosinophils Relative: 6 %
HCT: 41.2 % (ref 39.0–52.0)
Hemoglobin: 13 g/dL (ref 13.0–17.0)
Immature Granulocytes: 0 %
Lymphocytes Relative: 37 %
Lymphs Abs: 1.9 10*3/uL (ref 0.7–4.0)
MCH: 27.8 pg (ref 26.0–34.0)
MCHC: 31.6 g/dL (ref 30.0–36.0)
MCV: 88 fL (ref 80.0–100.0)
Monocytes Absolute: 0.5 10*3/uL (ref 0.1–1.0)
Monocytes Relative: 9 %
Neutro Abs: 2.4 10*3/uL (ref 1.7–7.7)
Neutrophils Relative %: 47 %
Platelets: 291 10*3/uL (ref 150–400)
RBC: 4.68 MIL/uL (ref 4.22–5.81)
RDW: 12.8 % (ref 11.5–15.5)
WBC: 5.1 10*3/uL (ref 4.0–10.5)
nRBC: 0 % (ref 0.0–0.2)

## 2019-08-08 LAB — BASIC METABOLIC PANEL
Anion gap: 11 (ref 5–15)
BUN: 17 mg/dL (ref 6–20)
CO2: 25 mmol/L (ref 22–32)
Calcium: 9 mg/dL (ref 8.9–10.3)
Chloride: 104 mmol/L (ref 98–111)
Creatinine, Ser: 0.96 mg/dL (ref 0.61–1.24)
GFR calc Af Amer: >60 mL/min (ref >60)
GFR calc non Af Amer: >60 mL/min (ref >60)
Glucose, Bld: 115 mg/dL — ABNORMAL HIGH (ref 70–99)
Potassium: 3.8 mmol/L (ref 3.5–5.1)
Sodium: 140 mmol/L (ref 135–145)

## 2019-08-08 LAB — APTT: aPTT: 30 seconds (ref 24–36)

## 2019-08-08 LAB — PROTIME-INR
INR: 1 (ref 0.8–1.2)
Prothrombin Time: 13.2 seconds (ref 11.4–15.2)

## 2019-08-08 SURGERY — ENDARTERECTOMY, CAROTID
Anesthesia: General | Laterality: Left

## 2019-08-08 MED ORDER — PROPOFOL 500 MG/50ML IV EMUL
INTRAVENOUS | Status: AC
  Filled 2019-08-08: qty 50

## 2019-08-08 MED ORDER — CEFAZOLIN SODIUM-DEXTROSE 2-4 GM/100ML-% IV SOLN: 2.0000 g | INTRAVENOUS | Status: DC

## 2019-08-08 MED ORDER — PROPOFOL 10 MG/ML IV BOLUS
INTRAVENOUS | Status: AC
  Filled 2019-08-08: qty 20

## 2019-08-08 MED ORDER — CHLORHEXIDINE GLUCONATE CLOTH 2 % EX PADS: 6.0000 | MEDICATED_PAD | Freq: Once | CUTANEOUS | Status: DC

## 2019-08-08 MED ORDER — ROCURONIUM BROMIDE 50 MG/5ML IV SOLN
INTRAVENOUS | Status: AC
  Filled 2019-08-08: qty 1

## 2019-08-08 MED ORDER — LIDOCAINE HCL (PF) 2 % IJ SOLN
INTRAMUSCULAR | Status: AC
  Filled 2019-08-08: qty 10

## 2019-08-08 MED ORDER — SUCCINYLCHOLINE CHLORIDE 20 MG/ML IJ SOLN
INTRAMUSCULAR | Status: AC
  Filled 2019-08-08: qty 1

## 2019-08-08 MED ORDER — LACTATED RINGERS IV SOLN: INTRAVENOUS | Status: DC

## 2019-08-08 SURGICAL SUPPLY — 57 items
BAG DECANTER FOR FLEXI CONT (MISCELLANEOUS) ×2 IMPLANT
BLADE SURG 15 STRL LF DISP TIS (BLADE) ×1 IMPLANT
BLADE SURG 15 STRL SS (BLADE) ×1
BLADE SURG SZ11 CARB STEEL (BLADE) ×2 IMPLANT
BOOT SUTURE AID YELLOW STND (SUTURE) ×2 IMPLANT
BRUSH SCRUB EZ  4% CHG (MISCELLANEOUS) ×1
BRUSH SCRUB EZ 4% CHG (MISCELLANEOUS) ×1 IMPLANT
CANISTER SUCT 1200ML W/VALVE (MISCELLANEOUS) ×2 IMPLANT
CHLORAPREP W/TINT 26ML (MISCELLANEOUS) ×2 IMPLANT
COVER WAND RF STERILE (DRAPES) ×2 IMPLANT
DERMABOND ADVANCED (GAUZE/BANDAGES/DRESSINGS) ×1
DERMABOND ADVANCED .7 DNX12 (GAUZE/BANDAGES/DRESSINGS) ×1 IMPLANT
DRAPE 3/4 80X56 (DRAPES) ×2 IMPLANT
DRAPE INCISE IOBAN 66X45 STRL (DRAPES) ×2 IMPLANT
DRAPE LAPAROTOMY 77X122 PED (DRAPES) ×2 IMPLANT
ELECT CAUTERY BLADE 6.4 (BLADE) ×2 IMPLANT
ELECT REM PT RETURN 9FT ADLT (ELECTROSURGICAL) ×2
ELECTRODE REM PT RTRN 9FT ADLT (ELECTROSURGICAL) ×1 IMPLANT
GLOVE BIO SURGEON STRL SZ7 (GLOVE) ×6 IMPLANT
GLOVE INDICATOR 7.5 STRL GRN (GLOVE) ×2 IMPLANT
GOWN STRL REUS W/ TWL LRG LVL3 (GOWN DISPOSABLE) ×2 IMPLANT
GOWN STRL REUS W/ TWL XL LVL3 (GOWN DISPOSABLE) ×2 IMPLANT
GOWN STRL REUS W/TWL LRG LVL3 (GOWN DISPOSABLE) ×2
GOWN STRL REUS W/TWL XL LVL3 (GOWN DISPOSABLE) ×2
HEMOSTAT SURGICEL 2X3 (HEMOSTASIS) ×2 IMPLANT
IV NS 250ML (IV SOLUTION) ×1
IV NS 250ML BAXH (IV SOLUTION) ×1 IMPLANT
KIT TURNOVER KIT A (KITS) ×2 IMPLANT
LABEL OR SOLS (LABEL) ×2 IMPLANT
LOOP RED MAXI  1X406MM (MISCELLANEOUS) ×2
LOOP VESSEL MAXI 1X406 RED (MISCELLANEOUS) ×2 IMPLANT
LOOP VESSEL MINI 0.8X406 BLUE (MISCELLANEOUS) ×1 IMPLANT
LOOPS BLUE MINI 0.8X406MM (MISCELLANEOUS) ×1
NEEDLE FILTER BLUNT 18X 1/2SAF (NEEDLE) ×1
NEEDLE FILTER BLUNT 18X1 1/2 (NEEDLE) ×1 IMPLANT
NEEDLE HYPO 25X1 1.5 SAFETY (NEEDLE) ×2 IMPLANT
NS IRRIG 500ML POUR BTL (IV SOLUTION) ×2 IMPLANT
PACK BASIN MAJOR ARMC (MISCELLANEOUS) ×2 IMPLANT
PENCIL ELECTRO HAND CTR (MISCELLANEOUS) IMPLANT
SHUNT W TPORT 9FR PRUITT F3 (SHUNT) ×2 IMPLANT
SUT MNCRL 4-0 (SUTURE) ×1
SUT MNCRL 4-0 27XMFL (SUTURE) ×1
SUT PROLENE 6 0 BV (SUTURE) ×8 IMPLANT
SUT PROLENE 7 0 BV 1 (SUTURE) ×4 IMPLANT
SUT SILK 2 0 (SUTURE) ×1
SUT SILK 2-0 18XBRD TIE 12 (SUTURE) ×1 IMPLANT
SUT SILK 3 0 (SUTURE) ×1
SUT SILK 3-0 18XBRD TIE 12 (SUTURE) ×1 IMPLANT
SUT SILK 4 0 (SUTURE) ×1
SUT SILK 4-0 18XBRD TIE 12 (SUTURE) ×1 IMPLANT
SUT VIC AB 3-0 SH 27 (SUTURE) ×2
SUT VIC AB 3-0 SH 27X BRD (SUTURE) ×2 IMPLANT
SUTURE MNCRL 4-0 27XMF (SUTURE) ×1 IMPLANT
SYR 10ML LL (SYRINGE) ×4 IMPLANT
SYR 20ML LL LF (SYRINGE) ×2 IMPLANT
TRAY FOLEY MTR SLVR 16FR STAT (SET/KITS/TRAYS/PACK) ×2 IMPLANT
TUBING CONNECTING 10 (TUBING) IMPLANT

## 2019-08-08 NOTE — Anesthesia Preprocedure Evaluation (Deleted)
Anesthesia Evaluation  Patient identified by MRN, date of birth, ID band Patient awake    Reviewed: Allergy & Precautions, H&P , NPO status , reviewed documented beta blocker date and time   Airway        Dental   Pulmonary Current Smoker and Patient abstained from smoking.,           Cardiovascular hypertension, + Peripheral Vascular Disease    05/2019 ECHO IMPRESSIONS    1. The left ventricle has normal systolic function with an ejection fraction of 60-65%. The cavity size was normal. Left ventricular diastolic parameters were normal.  2. The right ventricle has normal systolic function. The cavity was normal. There is no increase in right ventricular wall thickness.  3. The mitral valve is grossly normal.  4. The aortic valve was not well visualized. Mild thickening of the aortic valve. Mild calcification of the aortic valve. Aortic valve regurgitation is trivial by color flow Doppler.  5. The aorta is normal in size and structure.  6. The interatrial septum was not assessed.   Neuro/Psych  Neuromuscular disease    GI/Hepatic GERD  ,(+) Hepatitis -  Endo/Other    Renal/GU      Musculoskeletal  (+) Fibromyalgia -  Abdominal   Peds  Hematology   Anesthesia Other Findings Past Medical History: No date: Benign essential hypertension No date: Chest pain No date: Chronic hepatitis C without hepatic coma ([facility])     Comment:  treated  No date: Fibromyalgia No date: GERD (gastroesophageal reflux disease) No date: Hyperlipidemia, mixed No date: Hypertriglyceridemia No date: Platelet inhibition due to Plavix No date: PVD (peripheral vascular disease) ([place]) Past Surgical History: 10/02/2018: COLONOSCOPY WITH PROPOFOL; N/A     Comment:  Procedure: COLONOSCOPY WITH PROPOFOL;  Surgeon: Jonathon Bellows, MD;  Location: [facility] ENDOSCOPY;  Service:               Gastroenterology;  Laterality: N/A; 09/21/2015:  ESOPHAGOGASTRODUODENOSCOPY (EGD) WITH PROPOFOL; N/A     Comment:  Procedure: ESOPHAGOGASTRODUODENOSCOPY (EGD) WITH               PROPOFOL;  Surgeon: Josefine Class, MD;  Location:               [facility] ENDOSCOPY;  Service: Endoscopy;  Laterality: N/A; No date: stent lower leg   Reproductive/Obstetrics                             Anesthesia Physical Anesthesia Plan  ASA: III  Anesthesia Plan: General   Post-op Pain Management:    Induction: Intravenous  PONV Risk Score and Plan: 1 and Midazolam, Ondansetron, Dexamethasone and Treatment may vary due to age or medical condition  Airway Management Planned: Oral ETT  Additional Equipment:   Intra-op Plan:   Post-operative Plan: Extubation in OR  Informed Consent: I have reviewed the patients History and Physical, chart, labs and discussed the procedure including the risks, benefits and alternatives for the proposed anesthesia with the patient or authorized representative who has indicated his/her understanding and acceptance.     Dental Advisory Given  Plan Discussed with: CRNA  Anesthesia Plan Comments:         Anesthesia Quick Evaluation

## 2019-08-08 NOTE — Progress Notes (Signed)
Patients UDS was positive for MDMA. Patient instructed that Dr Lucky Cowboy will call and reschedule his surgery.

## 2019-08-15 DIAGNOSIS — Z01818 Encounter for other preprocedural examination: Secondary | ICD-10-CM | POA: Diagnosis not present

## 2019-08-19 ENCOUNTER — Encounter (INDEPENDENT_AMBULATORY_CARE_PROVIDER_SITE_OTHER): Payer: Self-pay

## 2019-08-22 ENCOUNTER — Other Ambulatory Visit (INDEPENDENT_AMBULATORY_CARE_PROVIDER_SITE_OTHER): Payer: Self-pay | Admitting: Nurse Practitioner

## 2019-08-23 ENCOUNTER — Other Ambulatory Visit: Payer: Self-pay

## 2019-08-23 ENCOUNTER — Encounter: Payer: Self-pay | Admitting: Anesthesiology

## 2019-08-23 ENCOUNTER — Other Ambulatory Visit
Admission: RE | Admit: 2019-08-23 | Discharge: 2019-08-23 | Disposition: A | Discharge status: Home / Self Care | Payer: Medicaid Other | Source: Ambulatory Visit | Attending: Vascular Surgery | Admitting: Vascular Surgery

## 2019-08-23 DIAGNOSIS — Z01818 Encounter for other preprocedural examination: Secondary | ICD-10-CM | POA: Insufficient documentation

## 2019-08-23 DIAGNOSIS — R001 Bradycardia, unspecified: Secondary | ICD-10-CM | POA: Insufficient documentation

## 2019-08-23 DIAGNOSIS — R9431 Abnormal electrocardiogram [ECG] [EKG]: Secondary | ICD-10-CM | POA: Diagnosis not present

## 2019-08-23 DIAGNOSIS — Z01810 Encounter for preprocedural cardiovascular examination: Secondary | ICD-10-CM | POA: Diagnosis not present

## 2019-08-23 LAB — BASIC METABOLIC PANEL
Anion gap: 8 (ref 5–15)
BUN: 15 mg/dL (ref 6–20)
CO2: 26 mmol/L (ref 22–32)
Calcium: 9.3 mg/dL (ref 8.9–10.3)
Chloride: 104 mmol/L (ref 98–111)
Creatinine, Ser: 1.13 mg/dL (ref 0.61–1.24)
GFR calc Af Amer: >60 mL/min (ref >60)
GFR calc non Af Amer: >60 mL/min (ref >60)
Glucose, Bld: 131 mg/dL — ABNORMAL HIGH (ref 70–99)
Potassium: 3.4 mmol/L — ABNORMAL LOW (ref 3.5–5.1)
Sodium: 138 mmol/L (ref 135–145)

## 2019-08-23 LAB — CBC WITH DIFFERENTIAL/PLATELET
Abs Immature Granulocytes: 0.02 10*3/uL (ref 0.00–0.07)
Basophils Absolute: 0.1 10*3/uL (ref 0.0–0.1)
Basophils Relative: 1 %
Eosinophils Absolute: 0.3 10*3/uL (ref 0.0–0.5)
Eosinophils Relative: 4 %
HCT: 40.8 % (ref 39.0–52.0)
Hemoglobin: 12.9 g/dL — ABNORMAL LOW (ref 13.0–17.0)
Immature Granulocytes: 0 %
Lymphocytes Relative: 27 %
Lymphs Abs: 2 10*3/uL (ref 0.7–4.0)
MCH: 27.8 pg (ref 26.0–34.0)
MCHC: 31.6 g/dL (ref 30.0–36.0)
MCV: 87.9 fL (ref 80.0–100.0)
Monocytes Absolute: 0.8 10*3/uL (ref 0.1–1.0)
Monocytes Relative: 11 %
Neutro Abs: 4.1 10*3/uL (ref 1.7–7.7)
Neutrophils Relative %: 57 %
Platelets: 258 10*3/uL (ref 150–400)
RBC: 4.64 MIL/uL (ref 4.22–5.81)
RDW: 12.9 % (ref 11.5–15.5)
WBC: 7.1 10*3/uL (ref 4.0–10.5)
nRBC: 0 % (ref 0.0–0.2)

## 2019-08-23 LAB — PROTIME-INR
INR: 1.2 (ref 0.8–1.2)
Prothrombin Time: 15 seconds (ref 11.4–15.2)

## 2019-08-23 LAB — TYPE AND SCREEN
ABO/RH(D): O POS
Antibody Screen: NEGATIVE

## 2019-08-23 LAB — APTT: aPTT: 31 seconds (ref 24–36)

## 2019-08-23 NOTE — Patient Instructions (Signed)
Your procedure is scheduled on: Thursday 08/29/19 Report to [place]. To find out your arrival time please call [phone] between 1PM - 3PM on Wednesday 08/28/19.  Remember: Instructions that are not followed completely may result in serious medical risk, up to and including death, or upon the discretion of your surgeon and anesthesiologist your surgery may need to be rescheduled.     _X__ 1. Do not eat food after midnight the night before your procedure.                 No gum chewing or hard candies. You may drink clear liquids up to 2 hours                 before you are scheduled to arrive for your surgery- DO not drink clear                 liquids within 2 hours of the start of your surgery.                 Clear Liquids include:  water, apple juice without pulp, clear carbohydrate                 drink such as Clearfast or Gatorade, Black Coffee or Tea (Do not add                 anything to coffee or tea). Diabetics water only  __X__2.  On the morning of surgery brush your teeth with toothpaste and water, you                 may rinse your mouth with mouthwash if you wish.  Do not swallow any              toothpaste of mouthwash.     _X__ 3.  No Alcohol for 24 hours before or after surgery.   _X__ 4.  Do Not Smoke or use e-cigarettes For 24 Hours Prior to Your Surgery.                 Do not use any chewable tobacco products for at least 6 hours prior to                 surgery.  ____  5.  Bring all medications with you on the day of surgery if instructed.   __X__  6.  Notify your doctor if there is any change in your medical condition      (cold, fever, infections).     Do not wear jewelry, make-up, hairpins, clips or nail polish. Do not wear lotions, powders, or perfumes.  Do not shave 48 hours prior to surgery. Men may shave face and neck. Do not bring valuables to the hospital.    [facility] is not responsible  for any belongings or valuables.  Contacts, dentures/partials or body piercings may not be worn into surgery. Bring a case for your contacts, glasses or hearing aids, a denture cup will be supplied. Leave your suitcase in the car. After surgery it may be brought to your room. For patients admitted to the hospital, discharge time is determined by your treatment team.   Patients discharged the day of surgery will not be allowed to drive home.   Please read over the following fact sheets that you were given:   MRSA Information  __X__ Take these medicines the morning of surgery with A SIP OF WATER:  1. amLODipine (NORVASC  2. fenofibrate (TRICOR  3. metoprolol succinate (TOPROL-XL  4. omeprazole (PRILOSEC  5. pregabalin (LYRICA  6.  ____ Fleet Enema (as directed)   __X__ Use CHG Soap/SAGE wipes as directed  ____ Use inhalers on the day of surgery  ____ Stop metformin/Janumet/Farxiga 2 days prior to surgery    ____ Take 1/2 of usual insulin dose the night before surgery. No insulin the morning          of surgery.   ____ Stop Blood Thinners Coumadin/Plavix/Xarelto/Pleta/Pradaxa/Eliquis/Effient/Aspirin  on   Or contact your Surgeon, Cardiologist or Medical Doctor regarding  ability to stop your blood thinners  __X__ Stop Anti-inflammatories 7 days before surgery such as Advil, Ibuprofen, Motrin,  BC or Goodies Powder, Naprosyn, Naproxen, Aleve, Aspirin    __X__ Stop all herbal supplements, fish oil or vitamin E until after surgery.    ____ Bring C-Pap to the hospital.

## 2019-08-23 NOTE — Pre-Procedure Instructions (Signed)
EKG reviewed with Dr Lavone Neri. Ok to proceed if UDS neg.

## 2019-08-23 NOTE — Pre-Procedure Instructions (Signed)
ECG 12-lead7/27/2020 [place] Component Name Value Ref Range  Vent Rate (bpm) 80   PR Interval (msec) 142   QRS Interval (msec) 94   QT Interval (msec) 396   QTc (msec) 456   Other Result Information  This result has an attachment that is not available.  Result Narrative  Normal sinus rhythm Normal ECG When compared with ECG of 12-Jun-2015 13:20, No significant change was found I reviewed and concur with this report. Electronically signed [id number] MD, Darnell Level [phone]) on 05/27/2019 1:29:41 PM

## 2019-08-26 ENCOUNTER — Other Ambulatory Visit
Admission: RE | Admit: 2019-08-26 | Discharge: 2019-08-26 | Disposition: A | Discharge status: Home / Self Care | Payer: Medicaid Other | Source: Ambulatory Visit | Attending: Vascular Surgery | Admitting: Vascular Surgery

## 2019-08-26 ENCOUNTER — Other Ambulatory Visit: Payer: Self-pay

## 2019-08-26 DIAGNOSIS — Z20828 Contact with and (suspected) exposure to other viral communicable diseases: Secondary | ICD-10-CM | POA: Diagnosis not present

## 2019-08-26 DIAGNOSIS — Z01812 Encounter for preprocedural laboratory examination: Secondary | ICD-10-CM | POA: Diagnosis present

## 2019-08-27 LAB — SARS CORONAVIRUS 2 (TAT 6-24 HRS): SARS Coronavirus 2: NEGATIVE

## 2019-08-28 ENCOUNTER — Telehealth (INDEPENDENT_AMBULATORY_CARE_PROVIDER_SITE_OTHER): Payer: Self-pay

## 2019-08-28 NOTE — Telephone Encounter (Signed)
Patient surgery has been rescheduled to 09/25/2019 and he will do his Covid testing on 09/23/2019 between 12:30-2:30 pm at the [facility]. Pre-surgical information will be mailed to the patient.

## 2019-08-28 NOTE — Telephone Encounter (Signed)
Patient called wanting to reschedule his surgery due to death in the family.

## 2019-09-23 ENCOUNTER — Other Ambulatory Visit
Admission: RE | Admit: 2019-09-23 | Discharge: 2019-09-23 | Disposition: A | Discharge status: Home / Self Care | Payer: Medicaid Other | Source: Ambulatory Visit | Attending: Vascular Surgery | Admitting: Vascular Surgery

## 2019-09-23 ENCOUNTER — Other Ambulatory Visit: Payer: Self-pay

## 2019-09-23 DIAGNOSIS — Z20828 Contact with and (suspected) exposure to other viral communicable diseases: Secondary | ICD-10-CM | POA: Insufficient documentation

## 2019-09-23 DIAGNOSIS — Z01812 Encounter for preprocedural laboratory examination: Secondary | ICD-10-CM | POA: Insufficient documentation

## 2019-09-24 LAB — SARS CORONAVIRUS 2 (TAT 6-24 HRS): SARS Coronavirus 2: NEGATIVE

## 2019-09-25 ENCOUNTER — Encounter: Payer: Self-pay | Admitting: *Deleted

## 2019-09-25 ENCOUNTER — Encounter
Admission: RE | Disposition: A | Discharge status: Home / Self Care | Payer: Self-pay | Source: Home / Self Care | Attending: Vascular Surgery

## 2019-09-25 ENCOUNTER — Ambulatory Visit
Admission: RE | Admit: 2019-09-25 | Discharge: 2019-09-25 | Disposition: A | Discharge status: Home / Self Care | Payer: Medicaid Other | Attending: Vascular Surgery | Admitting: Vascular Surgery

## 2019-09-25 ENCOUNTER — Other Ambulatory Visit: Payer: Self-pay

## 2019-09-25 DIAGNOSIS — F1290 Cannabis use, unspecified, uncomplicated: Secondary | ICD-10-CM | POA: Insufficient documentation

## 2019-09-25 DIAGNOSIS — Z538 Procedure and treatment not carried out for other reasons: Secondary | ICD-10-CM | POA: Diagnosis not present

## 2019-09-25 DIAGNOSIS — I6522 Occlusion and stenosis of left carotid artery: Secondary | ICD-10-CM | POA: Diagnosis present

## 2019-09-25 LAB — URINE DRUG SCREEN, QUALITATIVE (ARMC ONLY)
Amphetamines, Ur Screen: NOT DETECTED
Barbiturates, Ur Screen: NOT DETECTED
Benzodiazepine, Ur Scrn: NOT DETECTED
Cannabinoid 50 Ng, Ur ~~LOC~~: POSITIVE — AB
Cocaine Metabolite,Ur ~~LOC~~: POSITIVE — AB
MDMA (Ecstasy)Ur Screen: POSITIVE — AB
Methadone Scn, Ur: NOT DETECTED
Opiate, Ur Screen: NOT DETECTED
Phencyclidine (PCP) Ur S: NOT DETECTED
Tricyclic, Ur Screen: NOT DETECTED

## 2019-09-25 SURGERY — ENDARTERECTOMY, CAROTID
Anesthesia: General | Laterality: Left

## 2019-09-25 MED ORDER — CHLORHEXIDINE GLUCONATE CLOTH 2 % EX PADS: 6.0000 | MEDICATED_PAD | Freq: Once | CUTANEOUS | Status: DC

## 2019-09-25 MED ORDER — LACTATED RINGERS IV SOLN: INTRAVENOUS | Status: DC

## 2019-09-25 MED ORDER — CEFAZOLIN SODIUM-DEXTROSE 2-4 GM/100ML-% IV SOLN
INTRAVENOUS | Status: AC
  Filled 2019-09-25: qty 100

## 2019-09-25 MED ORDER — CEFAZOLIN SODIUM-DEXTROSE 2-4 GM/100ML-% IV SOLN: 2.0000 g | INTRAVENOUS | Status: DC

## 2019-09-25 SURGICAL SUPPLY — 57 items
BAG DECANTER FOR FLEXI CONT (MISCELLANEOUS) ×2 IMPLANT
BLADE SURG 15 STRL LF DISP TIS (BLADE) ×1 IMPLANT
BLADE SURG 15 STRL SS (BLADE) ×1
BLADE SURG SZ11 CARB STEEL (BLADE) ×2 IMPLANT
BOOT SUTURE AID YELLOW STND (SUTURE) ×2 IMPLANT
BRUSH SCRUB EZ  4% CHG (MISCELLANEOUS) ×1
BRUSH SCRUB EZ 4% CHG (MISCELLANEOUS) ×1 IMPLANT
CANISTER SUCT 1200ML W/VALVE (MISCELLANEOUS) ×2 IMPLANT
CHLORAPREP W/TINT 26ML (MISCELLANEOUS) ×2 IMPLANT
COVER WAND RF STERILE (DRAPES) ×2 IMPLANT
DERMABOND ADVANCED (GAUZE/BANDAGES/DRESSINGS) ×1
DERMABOND ADVANCED .7 DNX12 (GAUZE/BANDAGES/DRESSINGS) ×1 IMPLANT
DRAPE 3/4 80X56 (DRAPES) ×2 IMPLANT
DRAPE INCISE IOBAN 66X45 STRL (DRAPES) ×2 IMPLANT
DRAPE LAPAROTOMY 77X122 PED (DRAPES) ×2 IMPLANT
ELECT CAUTERY BLADE 6.4 (BLADE) ×2 IMPLANT
ELECT REM PT RETURN 9FT ADLT (ELECTROSURGICAL) ×2
ELECTRODE REM PT RTRN 9FT ADLT (ELECTROSURGICAL) ×1 IMPLANT
GLOVE BIO SURGEON STRL SZ7 (GLOVE) ×6 IMPLANT
GLOVE INDICATOR 7.5 STRL GRN (GLOVE) ×2 IMPLANT
GOWN STRL REUS W/ TWL LRG LVL3 (GOWN DISPOSABLE) ×2 IMPLANT
GOWN STRL REUS W/ TWL XL LVL3 (GOWN DISPOSABLE) ×2 IMPLANT
GOWN STRL REUS W/TWL LRG LVL3 (GOWN DISPOSABLE) ×2
GOWN STRL REUS W/TWL XL LVL3 (GOWN DISPOSABLE) ×2
HEMOSTAT SURGICEL 2X3 (HEMOSTASIS) ×2 IMPLANT
IV NS 250ML (IV SOLUTION) ×1
IV NS 250ML BAXH (IV SOLUTION) ×1 IMPLANT
KIT TURNOVER KIT A (KITS) ×2 IMPLANT
LABEL OR SOLS (LABEL) ×2 IMPLANT
LOOP RED MAXI  1X406MM (MISCELLANEOUS) ×2
LOOP VESSEL MAXI 1X406 RED (MISCELLANEOUS) ×2 IMPLANT
LOOP VESSEL MINI 0.8X406 BLUE (MISCELLANEOUS) ×1 IMPLANT
LOOPS BLUE MINI 0.8X406MM (MISCELLANEOUS) ×1
NEEDLE FILTER BLUNT 18X 1/2SAF (NEEDLE) ×1
NEEDLE FILTER BLUNT 18X1 1/2 (NEEDLE) ×1 IMPLANT
NEEDLE HYPO 25X1 1.5 SAFETY (NEEDLE) ×2 IMPLANT
NS IRRIG 500ML POUR BTL (IV SOLUTION) ×2 IMPLANT
PACK BASIN MAJOR ARMC (MISCELLANEOUS) ×2 IMPLANT
PENCIL ELECTRO HAND CTR (MISCELLANEOUS) IMPLANT
SHUNT W TPORT 9FR PRUITT F3 (SHUNT) ×2 IMPLANT
SUT MNCRL 4-0 (SUTURE) ×1
SUT MNCRL 4-0 27XMFL (SUTURE) ×1
SUT PROLENE 6 0 BV (SUTURE) ×8 IMPLANT
SUT PROLENE 7 0 BV 1 (SUTURE) ×4 IMPLANT
SUT SILK 2 0 (SUTURE) ×1
SUT SILK 2-0 18XBRD TIE 12 (SUTURE) ×1 IMPLANT
SUT SILK 3 0 (SUTURE) ×1
SUT SILK 3-0 18XBRD TIE 12 (SUTURE) ×1 IMPLANT
SUT SILK 4 0 (SUTURE) ×1
SUT SILK 4-0 18XBRD TIE 12 (SUTURE) ×1 IMPLANT
SUT VIC AB 3-0 SH 27 (SUTURE) ×2
SUT VIC AB 3-0 SH 27X BRD (SUTURE) ×2 IMPLANT
SUTURE MNCRL 4-0 27XMF (SUTURE) ×1 IMPLANT
SYR 10ML LL (SYRINGE) ×4 IMPLANT
SYR 20ML LL LF (SYRINGE) ×2 IMPLANT
TRAY FOLEY MTR SLVR 16FR STAT (SET/KITS/TRAYS/PACK) ×2 IMPLANT
TUBING CONNECTING 10 (TUBING) IMPLANT

## 2019-09-25 NOTE — Progress Notes (Signed)
Pt UDS positive for THC, Cocaine and MDMA. Notified Anesthesia Dr. Randa Lynn of above. Surgery cancelled for today. Also notified Dr. Lucky Cowboy. Patient informed as well and he reports '' I've been smoking marijuana for the pain in my legs, I guess I'm going to have to get a new dealer cause I've not been doing nothing else. '' Encouraged patient to abstain from all substance use, and encouraged to discuss with primary MD for pain management- so that patient could have surgery, he agreed and verbalized understanding. Pt informed he is to call office when ready to reschedule.

## 2019-12-20 ENCOUNTER — Ambulatory Visit (INDEPENDENT_AMBULATORY_CARE_PROVIDER_SITE_OTHER): Payer: Medicaid Other | Admitting: Vascular Surgery

## 2020-06-14 ENCOUNTER — Other Ambulatory Visit: Payer: Self-pay

## 2020-06-14 ENCOUNTER — Emergency Department
Admission: EM | Admit: 2020-06-14 | Discharge: 2020-06-14 | Disposition: A | Discharge status: Home / Self Care | Payer: Medicaid Other | Attending: Emergency Medicine | Admitting: Emergency Medicine

## 2020-06-14 ENCOUNTER — Emergency Department: Payer: Medicaid Other

## 2020-06-14 DIAGNOSIS — F17210 Nicotine dependence, cigarettes, uncomplicated: Secondary | ICD-10-CM | POA: Diagnosis not present

## 2020-06-14 DIAGNOSIS — I10 Essential (primary) hypertension: Secondary | ICD-10-CM | POA: Diagnosis not present

## 2020-06-14 DIAGNOSIS — U071 COVID-19: Secondary | ICD-10-CM | POA: Insufficient documentation

## 2020-06-14 DIAGNOSIS — R509 Fever, unspecified: Secondary | ICD-10-CM | POA: Diagnosis present

## 2020-06-14 LAB — SARS CORONAVIRUS 2 BY RT PCR (HOSPITAL ORDER, PERFORMED IN ~~LOC~~ HOSPITAL LAB): SARS Coronavirus 2: POSITIVE — AB

## 2020-06-14 MED ORDER — PREDNISONE 20 MG PO TABS
60.0000 mg | ORAL_TABLET | Freq: Once | ORAL | Status: AC
  Administered 2020-06-14: 60 mg via ORAL
  Filled 2020-06-14: qty 3

## 2020-06-14 MED ORDER — ALBUTEROL SULFATE HFA 108 (90 BASE) MCG/ACT IN AERS: 2.0000 | INHALATION_SPRAY | RESPIRATORY_TRACT | 0 refills | Status: DC | PRN

## 2020-06-14 MED ORDER — PREDNISONE 10 MG PO TABS: 10.0000 mg | ORAL_TABLET | Freq: Every day | ORAL | 0 refills | Status: DC

## 2020-06-14 MED ORDER — PSEUDOEPH-BROMPHEN-DM 30-2-10 MG/5ML PO SYRP: 10.0000 mL | ORAL_SOLUTION | Freq: Four times a day (QID) | ORAL | 0 refills | Status: DC | PRN

## 2020-06-14 NOTE — ED Provider Notes (Signed)
[facility] Emergency Department Provider Note  ____________________________________________  Time seen: Approximately 4:44 PM  I have reviewed the triage vital signs and the nursing notes.   HISTORY  Chief Complaint Cough and Headache    HPI Ricky King is a 54 y.o. male who presents emergency department complaining of fever, headache, nasal congestion, cough x3 to 4 days.  Patient states that he was exposed to his daughter who is positive for Covid.  Patient with no difficulty breathing.  Cough is mostly nonproductive.  Patient with no headache, visual changes, neck pain or stiffness, chest pain, abdominal pain, nausea vomiting, diarrhea or constipation.  Patient has not tried any over-the-counter medications for this complaint.  Only medications patient has taken is his chronic prescribed medications for chronic medical issues.  No complaints of chronic medical issues to include hypertension, fibromyalgia, GERD, peripheral vascular disease.        Past Medical History:  Diagnosis Date  . Benign essential hypertension   . Chest pain   . Chronic hepatitis C without hepatic coma ([place])    treated   . Fibromyalgia   . GERD (gastroesophageal reflux disease)   . Hyperlipidemia, mixed   . Hypertriglyceridemia   . Platelet inhibition due to Plavix   . PVD (peripheral vascular disease) [facility])     Patient Active Problem List   Diagnosis Date Noted  . Essential hypertension 04/05/2019  . Hyperlipidemia 04/05/2019  . Carotid stenosis 04/05/2019    Past Surgical History:  Procedure Laterality Date  . COLONOSCOPY WITH PROPOFOL N/A 10/02/2018   Procedure: COLONOSCOPY WITH PROPOFOL;  Surgeon: Jonathon Bellows, MD;  Location: [facility] ENDOSCOPY;  Service: Gastroenterology;  Laterality: N/A;  . ESOPHAGOGASTRODUODENOSCOPY (EGD) WITH PROPOFOL N/A 09/21/2015   Procedure: ESOPHAGOGASTRODUODENOSCOPY (EGD) WITH PROPOFOL;  Surgeon: Josefine Class, MD;  Location: [facility]  ENDOSCOPY;  Service: Endoscopy;  Laterality: N/A;  . stent lower leg      Prior to Admission medications   Medication Sig Start Date End Date Taking? Authorizing Provider  albuterol (VENTOLIN HFA) 108 (90 Base) MCG/ACT inhaler Inhale 2 puffs into the lungs every 4 (four) hours as needed for wheezing or shortness of breath. 06/14/20   Gerson Fauth, Charline Bills, PA-C  amLODipine (NORVASC) 10 MG tablet Take 10 mg by mouth daily.    [provider]  aspirin 81 MG chewable tablet Chew by mouth daily.    [provider]  atorvastatin (LIPITOR) 40 MG tablet Take 40 mg by mouth daily.    [provider]  brompheniramine-pseudoephedrine-DM 30-2-10 MG/5ML syrup Take 10 mLs by mouth 4 (four) times daily as needed. 06/14/20   Jonel Sick, Charline Bills, PA-C  clopidogrel (PLAVIX) 75 MG tablet Take 75 mg by mouth daily. Held since previous vascular procedure.    [provider]  fenofibrate (TRICOR) 145 MG tablet Take 145 mg by mouth daily.    [provider]  metoprolol succinate (TOPROL-XL) 25 MG 24 hr tablet Take 12.5 mg by mouth daily.     [provider]  omeprazole (PRILOSEC) 40 MG capsule Take 40 mg by mouth daily.    [provider]  predniSONE (DELTASONE) 10 MG tablet Take 1 tablet (10 mg total) by mouth daily. 06/14/20   Caramia Boutin, Charline Bills, PA-C  pregabalin (LYRICA) 150 MG capsule Take 150 mg by mouth 2 (two) times daily.    [provider]    Allergies Duloxetine, Morphine and related, and Topiramate  History reviewed. No pertinent family history.  Social History Social  History   Tobacco Use  . Smoking status: Current Every Day Smoker    Packs/day: 0.25    Types: Cigarettes  . Smokeless tobacco: Never Used  Vaping Use  . Vaping Use: Never used  Substance Use Topics  . Alcohol use: No  . Drug use: Not Currently    Types: Marijuana, "Crack" cocaine     Review of Systems  Constitutional: Positive fever/chills Eyes:  No visual changes. No discharge ENT: Nasal congestion, sore throat. Cardiovascular: no chest pain. Respiratory: Positive cough. No SOB. Gastrointestinal: No abdominal pain.  No nausea, no vomiting.  No diarrhea.  No constipation. Genitourinary: Negative for dysuria. No hematuria Musculoskeletal: Negative for musculoskeletal pain. Skin: Negative for rash, abrasions, lacerations, ecchymosis. Neurological: Negative for headaches, focal weakness or numbness. 10-point ROS otherwise negative.  ____________________________________________   PHYSICAL EXAM:  VITAL SIGNS: ED Triage Vitals  Enc Vitals Group     BP 06/14/20 1400 (!) 143/85     Pulse Rate 06/14/20 1400 92     Resp 06/14/20 1400 14     Temp 06/14/20 1400 99.4 F (37.4 C)     Temp src --      SpO2 06/14/20 1400 96 %     Weight --      Height --      Head Circumference --      Peak Flow --      Pain Score 06/14/20 1343 6     Pain Loc --      Pain Edu? --      Excl. in [place]? --      Constitutional: Alert and oriented. Well appearing and in no acute distress. Eyes: Conjunctivae are normal. PERRL. EOMI. Head: Atraumatic. ENT:      Ears: EACs and TMs unremarkable bilaterally.      Nose: Moderate clear congestion/rhinnorhea.      Mouth/Throat: Mucous membranes are moist.  Oropharynx is nonerythematous and nonedematous.  Uvula is midline. Neck: No stridor.  Neck is supple full range of motion Hematological/Lymphatic/Immunilogical: No cervical lymphadenopathy Cardiovascular: Normal rate, regular rhythm. Normal S1 and S2.  Good peripheral circulation. Respiratory: Normal respiratory effort without tachypnea or retractions. Lungs CTAB with no wheezing, rales, rhonchi.  Patient has a hacking cough that is appreciated during exam.. Good air entry to the bases with no decreased or absent breath sounds. Gastrointestinal: Bowel sounds 4 quadrants. Soft and nontender to palpation. No guarding or rigidity. No palpable masses. No  distention.  Musculoskeletal: Full range of motion to all extremities. No gross deformities appreciated. Neurologic:  Normal speech and language. No gross focal neurologic deficits are appreciated.  Skin:  Skin is warm, dry and intact. No rash noted. Psychiatric: Mood and affect are normal. Speech and behavior are normal. Patient exhibits appropriate insight and judgement.   ____________________________________________   LABS (all labs ordered are listed, but only abnormal results are displayed)  Labs Reviewed  SARS CORONAVIRUS 2 BY RT PCR (HOSPITAL ORDER, [place] [facility]) - Abnormal; Notable for the following components:      Result Value   SARS Coronavirus 2 POSITIVE (*)    All other components within normal limits   ____________________________________________  EKG   ____________________________________________  RADIOLOGY I personally viewed and evaluated these images as part of my medical decision making, as well as reviewing the written report by the radiologist.  DG Chest 1 View  Result Date: 06/14/2020 CLINICAL DATA:  Cough, COVID-19 exposure, fever EXAM: CHEST  1 VIEW COMPARISON:  07/31/2019  FINDINGS: Single frontal view of the chest demonstrates scattered patchy ground-glass airspace disease worrisome for multifocal pneumonia. No effusion or pneumothorax. No acute bony abnormalities. Cardiac silhouette is unremarkable. IMPRESSION: 1. Scattered basilar predominant ground-glass opacities consistent with multifocal pneumonia. Pattern is compatible with COVID-19. Electronically Signed   By: Randa Ngo M.D.   On: 06/14/2020 18:20    ____________________________________________    PROCEDURES  Procedure(s) performed:    Procedures    Medications  predniSONE (DELTASONE) tablet 60 mg (has no administration in time range)     ____________________________________________   INITIAL IMPRESSION / ASSESSMENT AND PLAN / ED COURSE  Pertinent  labs & imaging results that were available during my care of the patient were reviewed by me and considered in my medical decision making (see chart for details).  Review of the [place] CSRS was performed in accordance of the [place] prior to dispensing any controlled drugs.           Patient's diagnosis is consistent with COVID-19.  Patient presented to emergency department complaining of cough, fever, headache.  Patient was around a family member positive for Covid.  Symptoms were consistent with Covid and patient is positive on testing.  Chest x-ray revealed no overlying bacterial infection but findings consistent with Covid pneumonia.  Patient replaced on albuterol, prednisone, Bromfed cough syrup.  Follow-up primary care as needed.  Return precautions are discussed with the patient. Patient is given ED precautions to return to the ED for any worsening or new symptoms.     ____________________________________________  FINAL CLINICAL IMPRESSION(S) / ED DIAGNOSES  Final diagnoses:  COVID-19      NEW MEDICATIONS STARTED DURING THIS VISIT:  ED Discharge Orders         Ordered    predniSONE (DELTASONE) 10 MG tablet  Daily       Note to Pharmacy: Take 6 pills x 2 days, 5 pills x 2 days, 4 pills x 2 days, 3 pills x 2 days, 2 pills x 2 days, and 1 pill x 2 days   06/14/20 1850    albuterol (VENTOLIN HFA) 108 (90 Base) MCG/ACT inhaler  Every 4 hours PRN        06/14/20 1850    brompheniramine-pseudoephedrine-DM 30-2-10 MG/5ML syrup  4 times daily PRN        06/14/20 1850              This chart was dictated using voice recognition software/Dragon. Despite best efforts to proofread, errors can occur which can change the meaning. Any change was purely unintentional.    Darletta Moll, PA-C 06/14/20 Yevette Edwards    Blake Divine, MD 06/14/20 2112

## 2020-06-14 NOTE — ED Triage Notes (Signed)
Pt presents c/o headache, hot flashes, and cough. Reports been around Covid + daughter.

## 2021-06-22 ENCOUNTER — Other Ambulatory Visit: Payer: Self-pay

## 2021-06-22 ENCOUNTER — Ambulatory Visit (INDEPENDENT_AMBULATORY_CARE_PROVIDER_SITE_OTHER): Payer: Medicaid Other | Admitting: Vascular Surgery

## 2021-06-22 VITALS — BP 108/84 | HR 71 | Ht 67.0 in | Wt 203.0 lb

## 2021-06-22 DIAGNOSIS — I70213 Atherosclerosis of native arteries of extremities with intermittent claudication, bilateral legs: Secondary | ICD-10-CM

## 2021-06-22 DIAGNOSIS — I10 Essential (primary) hypertension: Secondary | ICD-10-CM | POA: Diagnosis not present

## 2021-06-22 DIAGNOSIS — I6523 Occlusion and stenosis of bilateral carotid arteries: Secondary | ICD-10-CM | POA: Diagnosis not present

## 2021-06-22 DIAGNOSIS — E785 Hyperlipidemia, unspecified: Secondary | ICD-10-CM

## 2021-06-22 DIAGNOSIS — I70219 Atherosclerosis of native arteries of extremities with intermittent claudication, unspecified extremity: Secondary | ICD-10-CM | POA: Insufficient documentation

## 2021-06-22 NOTE — Assessment & Plan Note (Signed)
lipid control important in reducing the progression of atherosclerotic disease. Continue statin therapy  

## 2021-06-22 NOTE — Assessment & Plan Note (Signed)
The patient has previous bilateral lower extremity interventions several years ago that have not been assessed and quite some time.  He does still have some claudication symptoms but no limb threatening symptoms.  We will obtain ABIs in the near future at his convenience.  Continue aspirin, Plavix, and statin agent.

## 2021-06-22 NOTE — Progress Notes (Signed)
Patient ID: Ricky King, male   DOB: 15-Apr-1966, 55 y.o.   MRN: [id number]  Chief Complaint  Patient presents with   Follow-up    Est pt 05/07/19 consult referred by Dr. Royetta Crochet     HPI Ricky King is a 55 y.o. male.  I am asked to see the patient by Dr. Alene Mires for evaluation of carotid stenosis and PAD.  Patient has known history of peripheral arterial disease status post interventions to the leg 6 to 7 years ago.  He also has a known history of carotid artery disease and was actually scheduled for left carotid endarterectomy 2 years ago but his procedure was canceled for a positive urine drug screen.  Attempts to reschedule were unsuccessful with the patient.  He is now having visual symptoms in the right eye.  He has intermittent bouts of double vision, blurry vision, and some loss of vision in the right eye over the past several weeks.  He also has pain on the right side.  No arm or leg weakness or numbness.  No speech or swallowing difficulty.  As for his legs, he does not describe claudication symptoms.  He does not describe ischemic rest pain or ulceration of the lower extremities.  Both legs tire easily with activity and has cramping pain in both calves.     Past Medical History:  Diagnosis Date   Benign essential hypertension    Chest pain    Chronic hepatitis C without hepatic coma ([facility])    treated    Fibromyalgia    GERD (gastroesophageal reflux disease)    Hyperlipidemia, mixed    Hypertriglyceridemia    Platelet inhibition due to Plavix    PVD (peripheral vascular disease) ([place])     Past Surgical History:  Procedure Laterality Date   COLONOSCOPY WITH PROPOFOL N/A 10/02/2018   Procedure: COLONOSCOPY WITH PROPOFOL;  Surgeon: Jonathon Bellows, MD;  Location[facility] ENDOSCOPY;  Service: Gastroenterology;  Laterality: N/A;   ESOPHAGOGASTRODUODENOSCOPY (EGD) WITH PROPOFOL N/A 09/21/2015   Procedure: ESOPHAGOGASTRODUODENOSCOPY (EGD) WITH PROPOFOL;  Surgeon: Josefine Class, MD;  Location: [facility] ENDOSCOPY;  Service: Endoscopy;  Laterality: N/A;   stent lower leg      Family History No bleeding disorders, clotting disorders, autoimmune diseases or aneurysms   Social History   Tobacco Use   Smoking status: Every Day    Packs/day: 0.25    Types: Cigarettes   Smokeless tobacco: Never  Vaping Use   Vaping Use: Never used  Substance Use Topics   Alcohol use: No   Drug use: Not Currently    Types: Marijuana, "Crack" cocaine     Allergies  Allergen Reactions   Nortriptyline Other (See Comments)    Pt states it made him have bad dreams Pt states it made him have bad dreams    Duloxetine Other (See Comments)    Bizarre dreams   Morphine And Related Nausea Only   Topiramate Rash    Current Outpatient Medications  Medication Sig Dispense Refill   albuterol (VENTOLIN HFA) 108 (90 Base) MCG/ACT inhaler Inhale 2 puffs into the lungs every 4 (four) hours as needed for wheezing or shortness of breath. 8 g 0   amLODipine (NORVASC) 10 MG tablet Take 10 mg by mouth daily.     aspirin 81 MG chewable tablet Chew by mouth daily.     aspirin 81 MG EC tablet Take by mouth.     atorvastatin (LIPITOR) 10 MG tablet Take 1 tablet  by mouth daily.     atorvastatin (LIPITOR) 40 MG tablet Take 40 mg by mouth daily.     brompheniramine-pseudoephedrine-DM 30-2-10 MG/5ML syrup Take 10 mLs by mouth 4 (four) times daily as needed. 200 mL 0   clopidogrel (PLAVIX) 75 MG tablet Take 75 mg by mouth daily. Held since previous vascular procedure.     clopidogrel (PLAVIX) 75 MG tablet Take by mouth.     diclofenac Sodium (VOLTAREN) 1 % GEL Apply topically.     fenofibrate (TRICOR) 145 MG tablet Take 145 mg by mouth daily.     gemfibrozil (LOPID) 600 MG tablet Take by mouth.     lisinopril (ZESTRIL) 20 MG tablet Take by mouth.     metoprolol succinate (TOPROL-XL) 25 MG 24 hr tablet Take 12.5 mg by mouth daily.      omeprazole (PRILOSEC) 40 MG capsule Take 40 mg by mouth  daily.     omeprazole (PRILOSEC) 40 MG capsule Take by mouth.     pregabalin (LYRICA) 150 MG capsule Take 150 mg by mouth 2 (two) times daily.     pregabalin (LYRICA) 150 MG capsule Take by mouth.     sildenafil (VIAGRA) 100 MG tablet Take by mouth daily as needed.     predniSONE (DELTASONE) 10 MG tablet Take 1 tablet (10 mg total) by mouth daily. (Patient not taking: Reported on 06/22/2021) 42 tablet 0   No current facility-administered medications for this visit.      REVIEW OF SYSTEMS (Negative unless checked)  Constitutional: '[]'$ Weight loss  '[]'$ Fever  '[]'$ Chills Cardiac: '[]'$ Chest pain   '[]'$ Chest pressure   '[]'$ Palpitations   '[]'$ Shortness of breath when laying flat   '[]'$ Shortness of breath at rest   '[]'$ Shortness of breath with exertion. Vascular:  '[x]'$ Pain in legs with walking   '[]'$ Pain in legs at rest   '[]'$ Pain in legs when laying flat   '[x]'$ Claudication   '[]'$ Pain in feet when walking  '[]'$ Pain in feet at rest  '[]'$ Pain in feet when laying flat   '[]'$ History of DVT   '[]'$ Phlebitis   '[]'$ Swelling in legs   '[]'$ Varicose veins   '[]'$ Non-healing ulcers Pulmonary:   '[]'$ Uses home oxygen   '[]'$ Productive cough   '[]'$ Hemoptysis   '[]'$ Wheeze  '[]'$ COPD   '[]'$ Asthma Neurologic:  '[]'$ Dizziness  '[]'$ Blackouts   '[]'$ Seizures   '[]'$ History of stroke   '[]'$ History of TIA  '[]'$ Aphasia   '[]'$ Temporary blindness   '[]'$ Dysphagia   '[]'$ Weakness or numbness in arms   '[]'$ Weakness or numbness in legs Musculoskeletal:  '[x]'$ Arthritis   '[]'$ Joint swelling   '[]'$ Joint pain   '[]'$ Low back pain Hematologic:  '[]'$ Easy bruising  '[]'$ Easy bleeding   '[]'$ Hypercoagulable state   '[]'$ Anemic  '[]'$ Hepatitis Gastrointestinal:  '[]'$ Blood in stool   '[]'$ Vomiting blood  '[]'$ Gastroesophageal reflux/heartburn   '[]'$ Abdominal pain Genitourinary:  '[]'$ Chronic kidney disease   '[]'$ Difficult urination  '[]'$ Frequent urination  '[]'$ Burning with urination   '[]'$ Hematuria Skin:  '[]'$ Rashes   '[]'$ Ulcers   '[]'$ Wounds Psychological:  '[]'$ History of anxiety   '[]'$  History of major depression.    Physical Exam BP 108/84   Pulse 71   Ht '5\' 7"'$   (1.702 m)   Wt 203 lb (92.1 kg)   BMI 31.79 kg/m  Gen:  WD/WN, NAD Head: [place]/AT, No temporalis wasting.  Ear/Nose/Throat: Hearing grossly intact, nares w/o erythema or drainage, oropharynx w/o Erythema/Exudate Eyes: Conjunctiva clear, sclera non-icteric  Neck: trachea midline.  No JVD. Bilateral carotid bruits Pulmonary:  Good air movement, respirations not labored, no use of accessory muscles  Cardiac:  RRR, no JVD Vascular:  Vessel Right Left  Radial Palpable Palpable                          PT 1+ 1+  DP trace 1+   Gastrointestinal:. No masses, surgical incisions, or scars. Musculoskeletal: M/S 5/5 throughout.  Extremities without ischemic changes.  No deformity or atrophy. No LE edema. Neurologic: Sensation grossly intact in extremities.  Symmetrical.  Speech is fluent. Motor exam as listed above. Psychiatric: Judgment intact, Mood & affect appropriate for pt's clinical situation. Dermatologic: No rashes or ulcers noted.  No cellulitis or open wounds.    Radiology No results found.  Labs No results found for this or any previous visit (from the past 2160 hour(s)).  Assessment/Plan:  Essential hypertension lipid control important in reducing the progression of atherosclerotic disease. Continue statin therapy   Hyperlipidemia lipid control important in reducing the progression of atherosclerotic disease. Continue statin therapy   Atherosclerosis of native arteries of extremity with intermittent claudication ([place]) The patient has previous bilateral lower extremity interventions several years ago that have not been assessed and quite some time.  He does still have some claudication symptoms but no limb threatening symptoms.  We will obtain ABIs in the near future at his convenience.  Continue aspirin, Plavix, and statin agent.  Carotid stenosis The patient had known bilateral severe carotid disease 2 years ago.  He was scheduled for a left carotid and surgery was  canceled due to a positive urine drug screen.  We attempted to reschedule but this was never followed up on by the patient and now he is having right eye symptoms that are concerning for atheroembolic events.  He is on appropriate medical therapy.  We will going to obtain a carotid duplex to ensure that his carotid arteries have not occluded.  We will see him back with the study in the near future at his convenience.      Leotis Pain 06/22/2021, 1:04 PM   This note was created with Dragon medical transcription system.  Any errors from dictation are unintentional.

## 2021-06-22 NOTE — Assessment & Plan Note (Signed)
The patient had known bilateral severe carotid disease 2 years ago.  He was scheduled for a left carotid and surgery was canceled due to a positive urine drug screen.  We attempted to reschedule but this was never followed up on by the patient and now he is having right eye symptoms that are concerning for atheroembolic events.  He is on appropriate medical therapy.  We will going to obtain a carotid duplex to ensure that his carotid arteries have not occluded.  We will see him back with the study in the near future at his convenience.

## 2021-06-30 ENCOUNTER — Ambulatory Visit (INDEPENDENT_AMBULATORY_CARE_PROVIDER_SITE_OTHER): Payer: Medicaid Other

## 2021-06-30 ENCOUNTER — Other Ambulatory Visit: Payer: Self-pay

## 2021-06-30 ENCOUNTER — Encounter (INDEPENDENT_AMBULATORY_CARE_PROVIDER_SITE_OTHER): Payer: Self-pay | Admitting: Nurse Practitioner

## 2021-06-30 ENCOUNTER — Ambulatory Visit (INDEPENDENT_AMBULATORY_CARE_PROVIDER_SITE_OTHER): Payer: Medicaid Other | Admitting: Nurse Practitioner

## 2021-06-30 VITALS — BP 154/91 | HR 71 | Resp 16 | Wt 203.8 lb

## 2021-06-30 DIAGNOSIS — I10 Essential (primary) hypertension: Secondary | ICD-10-CM

## 2021-06-30 DIAGNOSIS — I70213 Atherosclerosis of native arteries of extremities with intermittent claudication, bilateral legs: Secondary | ICD-10-CM | POA: Diagnosis not present

## 2021-06-30 DIAGNOSIS — I6523 Occlusion and stenosis of bilateral carotid arteries: Secondary | ICD-10-CM

## 2021-07-02 ENCOUNTER — Encounter (INDEPENDENT_AMBULATORY_CARE_PROVIDER_SITE_OTHER): Payer: Self-pay | Admitting: Nurse Practitioner

## 2021-07-02 ENCOUNTER — Telehealth (INDEPENDENT_AMBULATORY_CARE_PROVIDER_SITE_OTHER): Payer: Self-pay | Admitting: Vascular Surgery

## 2021-07-02 NOTE — H&P (View-Only) (Signed)
Subjective:    Patient ID: Ricky King, male    DOB: [birth year], 55 y.o.   MRN: [id number] Chief Complaint  Patient presents with   Follow-up    Ultrasound follow up    Ricky King is a 55 y.o. male.  Patient returns today for follow-up of his known peripheral arterial disease.  The patient was scheduled for a left carotid endarterectomy 2 years ago but his procedure was canceled due to a positive urine drug screen.  We tried to reach the patient to reschedule but attempts were unsuccessful.  He now currently has visual symptoms in the right eye.  He has blurry vision as well as some loss of his peripheral vision.  The symptoms are concerning for some amaurosis fugax.  He denies any arm or leg weakness or numbness.  He denies any other signs symptoms of TIA or stroke.  He also experiences worsening claudication-like symptoms.  He denies rest pain but notes that his left leg is worse between the 2.  He has no wounds or ulcerations.  Today noninvasive studies show that the left ICA is occluded with a 40 to 59% stenosis of the right ICA.   Today the patient has ABI 0.77 on the right and 0.68 on the left.  He has monophasic tibial artery waveforms bilaterally with slightly dampened toe waveforms bilaterally.  Left TBI 0.58 with a TBI 0.76 on the right   Review of Systems  Eyes:  Positive for visual disturbance.  Cardiovascular:        Claudication  Neurological:  Positive for headaches.  All other systems reviewed and are negative.     Objective:   Physical Exam Vitals reviewed.  HENT:     Head: Normocephalic.  Neck:     Vascular: Carotid bruit present.  Cardiovascular:     Rate and Rhythm: Normal rate.     Comments: Nonpalpable pedal pulses Pulmonary:     Effort: Pulmonary effort is normal.  Skin:    General: Skin is warm and dry.  Neurological:     Mental Status: He is alert and oriented to person, place, and time.  Psychiatric:        Mood and Affect: Mood normal.         Behavior: Behavior normal.        Thought Content: Thought content normal.        Judgment: Judgment normal.    BP (!) 154/91 (BP Location: Right Arm)   Pulse 71   Resp 16   Wt 203 lb 12.8 oz (92.4 kg)   BMI 31.92 kg/m   Past Medical History:  Diagnosis Date   Benign essential hypertension    Chest pain    Chronic hepatitis C without hepatic coma (HCC)    treated    Fibromyalgia    GERD (gastroesophageal reflux disease)    Hyperlipidemia, mixed    Hypertriglyceridemia    Platelet inhibition due to Plavix    PVD (peripheral vascular disease) (HCC)     Social History   Socioeconomic History   Marital status: Single    Spouse name: Not on file   Number of children: Not on file   Years of education: Not on file   Highest education level: Not on file  Occupational History   Not on file  Tobacco Use   Smoking status: Every Day    Packs/day: 0.25    Types: Cigarettes   Smokeless tobacco: Never  Vaping Use   Vaping  Use: Never used  Substance and Sexual Activity   Alcohol use: No   Drug use: Not Currently    Types: Marijuana, "Crack" cocaine   Sexual activity: Not on file  Other Topics Concern   Not on file  Social History Narrative   Not on file   Social Determinants of Health   Financial Resource Strain: Not on file  Food Insecurity: Not on file  Transportation Needs: Not on file  Physical Activity: Not on file  Stress: Not on file  Social Connections: Not on file  Intimate Partner Violence: Not on file    Past Surgical History:  Procedure Laterality Date   COLONOSCOPY WITH PROPOFOL N/A 10/02/2018   Procedure: COLONOSCOPY WITH PROPOFOL;  Surgeon: Jonathon Bellows, MD;  Location: [facility] ENDOSCOPY;  Service: Gastroenterology;  Laterality: N/A;   ESOPHAGOGASTRODUODENOSCOPY (EGD) WITH PROPOFOL N/A 09/21/2015   Procedure: ESOPHAGOGASTRODUODENOSCOPY (EGD) WITH PROPOFOL;  Surgeon: Josefine Class, MD;  Location: [facility] ENDOSCOPY;  Service: Endoscopy;   Laterality: N/A;   stent lower leg      History reviewed. No pertinent family history.  Allergies  Allergen Reactions   Nortriptyline Other (See Comments)    Pt states it made him have bad dreams Pt states it made him have bad dreams    Duloxetine Other (See Comments)    Bizarre dreams   Morphine And Related Nausea Only   Topiramate Rash    CBC Latest Ref Rng & Units 08/23/2019 08/08/2019 06/27/2019  WBC 4.0 - 10.5 K/uL 7.1 5.1 4.6  Hemoglobin 13.0 - 17.0 g/dL 12.9(L) 13.0 13.3  Hematocrit 39.0 - 52.0 % 40.8 41.2 42.3  Platelets 150 - 400 K/uL 258 291 238      CMP     Component Value Date/Time   NA 138 08/23/2019 1057   NA 139 11/18/2014 0734   K 3.4 (L) 08/23/2019 1057   K 4.3 11/18/2014 0734   CL 104 08/23/2019 1057   CL 106 11/18/2014 0734   CO2 26 08/23/2019 1057   CO2 26 11/18/2014 0734   GLUCOSE 131 (H) 08/23/2019 1057   GLUCOSE 121 (H) 11/18/2014 0734   BUN 15 08/23/2019 1057   BUN 21 (H) 11/18/2014 0734   CREATININE 1.13 08/23/2019 1057   CREATININE 2.01 (H) 11/18/2014 0734   CALCIUM 9.3 08/23/2019 1057   CALCIUM 8.5 11/18/2014 0734   PROT 8.0 07/12/2017 1312   PROT 8.8 (H) 08/01/2013 1250   ALBUMIN 4.0 07/12/2017 1312   ALBUMIN 4.2 08/01/2013 1250   AST 47 (H) 07/12/2017 1312   AST 54 (H) 08/01/2013 1250   ALT 39 07/12/2017 1312   ALT 65 08/01/2013 1250   ALKPHOS 51 07/12/2017 1312   ALKPHOS 86 08/01/2013 1250   BILITOT 1.9 (H) 07/12/2017 1312   BILITOT 0.9 08/01/2013 1250   GFRNONAA >60 08/23/2019 1057   GFRNONAA 38 (L) 11/18/2014 0734   GFRNONAA >60 06/17/2014 0719   GFRAA >60 08/23/2019 1057   GFRAA 46 (L) 11/18/2014 0734   GFRAA >60 06/17/2014 0719     No results found.     Assessment & Plan:   1. Bilateral carotid artery stenosis The patient has noted occlusion of the left ICA.  Noninvasive studies today show a 40 to 59% stenosis of the right ICA however the patient is having visual changes that are concerning for possible amaurosis  fugax.  Based on this we will have a CT scan done to try to evaluate if that level of stenosis is worse than what is  noted on ultrasound.  Highly calcified lesions can shows a lower stenosis on ultrasound versus CT.  We will have the patient follow-up after the scans. - CT ANGIO NECK W OR WO CONTRAST; Future  2. Atherosclerosis of native artery of both lower extremities with intermittent claudication (HCC) Today noninvasive studies show that the patient certainly has evidence of peripheral arterial disease.  Pending his CT scan of his carotid arteries, we will plan on performing an angiogram of his left followed by his right lower extremity.  The patient is experiencing severe claudication.  His left lower extremity is of more concern from currently.  I discussed the procedure as well as the risk, benefits and alternatives.  The patient does agree to proceed with angiogram.  3. Essential hypertension Continue antihypertensive medications as already ordered, these medications have been reviewed and there are no changes at this time.    Current Outpatient Medications on File Prior to Visit  Medication Sig Dispense Refill   albuterol (VENTOLIN HFA) 108 (90 Base) MCG/ACT inhaler Inhale 2 puffs into the lungs every 4 (four) hours as needed for wheezing or shortness of breath. 8 g 0   amLODipine (NORVASC) 10 MG tablet Take 10 mg by mouth daily.     aspirin 81 MG EC tablet Take by mouth.     clopidogrel (PLAVIX) 75 MG tablet Take 75 mg by mouth daily. Held since previous vascular procedure.     clopidogrel (PLAVIX) 75 MG tablet Take by mouth.     diclofenac Sodium (VOLTAREN) 1 % GEL Apply topically.     fenofibrate (TRICOR) 145 MG tablet Take 145 mg by mouth daily.     metoprolol succinate (TOPROL-XL) 25 MG 24 hr tablet Take 12.5 mg by mouth daily.      omeprazole (PRILOSEC) 40 MG capsule Take 40 mg by mouth daily.     omeprazole (PRILOSEC) 40 MG capsule Take by mouth.     pregabalin (LYRICA) 150 MG  capsule Take 150 mg by mouth 2 (two) times daily.     pregabalin (LYRICA) 150 MG capsule Take by mouth.     sildenafil (VIAGRA) 100 MG tablet Take by mouth daily as needed.     aspirin 81 MG chewable tablet Chew by mouth daily. (Patient not taking: Reported on 06/30/2021)     atorvastatin (LIPITOR) 10 MG tablet Take 1 tablet by mouth daily. (Patient not taking: Reported on 06/30/2021)     atorvastatin (LIPITOR) 40 MG tablet Take 40 mg by mouth daily. (Patient not taking: Reported on 06/30/2021)     brompheniramine-pseudoephedrine-DM 30-2-10 MG/5ML syrup Take 10 mLs by mouth 4 (four) times daily as needed. (Patient not taking: Reported on 06/30/2021) 200 mL 0   gemfibrozil (LOPID) 600 MG tablet Take by mouth. (Patient not taking: Reported on 06/30/2021)     lisinopril (ZESTRIL) 20 MG tablet Take by mouth. (Patient not taking: Reported on 06/30/2021)     predniSONE (DELTASONE) 10 MG tablet Take 1 tablet (10 mg total) by mouth daily. (Patient not taking: No sig reported) 42 tablet 0   No current facility-administered medications on file prior to visit.    There are no Patient Instructions on file for this visit. No follow-ups on file.   Kris Hartmann, NP

## 2021-07-02 NOTE — Progress Notes (Signed)
Subjective:    Patient ID: Ricky King, male    DOB: March 25, 1966, 55 y.o.   MRN: [id number] Chief Complaint  Patient presents with   Follow-up    Ultrasound follow up    Ricky King is a 55 y.o. male.  Patient returns today for follow-up of his known peripheral arterial disease.  The patient was scheduled for a left carotid endarterectomy 2 years ago but his procedure was canceled due to a positive urine drug screen.  We tried to reach the patient to reschedule but attempts were unsuccessful.  He now currently has visual symptoms in the right eye.  He has blurry vision as well as some loss of his peripheral vision.  The symptoms are concerning for some amaurosis fugax.  He denies any arm or leg weakness or numbness.  He denies any other signs symptoms of TIA or stroke.  He also experiences worsening claudication-like symptoms.  He denies rest pain but notes that his left leg is worse between the 2.  He has no wounds or ulcerations.  Today noninvasive studies show that the left ICA is occluded with a 40 to 59% stenosis of the right ICA.   Today the patient has ABI 0.77 on the right and 0.68 on the left.  He has monophasic tibial artery waveforms bilaterally with slightly dampened toe waveforms bilaterally.  Left TBI 0.58 with a TBI 0.76 on the right   Review of Systems  Eyes:  Positive for visual disturbance.  Cardiovascular:        Claudication  Neurological:  Positive for headaches.  All other systems reviewed and are negative.     Objective:   Physical Exam Vitals reviewed.  HENT:     Head: Normocephalic.  Neck:     Vascular: Carotid bruit present.  Cardiovascular:     Rate and Rhythm: Normal rate.     Comments: Nonpalpable pedal pulses Pulmonary:     Effort: Pulmonary effort is normal.  Skin:    General: Skin is warm and dry.  Neurological:     Mental Status: He is alert and oriented to person, place, and time.  Psychiatric:        Mood and Affect: Mood normal.         Behavior: Behavior normal.        Thought Content: Thought content normal.        Judgment: Judgment normal.    BP (!) 154/91 (BP Location: Right Arm)   Pulse 71   Resp 16   Wt 203 lb 12.8 oz (92.4 kg)   BMI 31.92 kg/m   Past Medical History:  Diagnosis Date   Benign essential hypertension    Chest pain    Chronic hepatitis C without hepatic coma (HCC)    treated    Fibromyalgia    GERD (gastroesophageal reflux disease)    Hyperlipidemia, mixed    Hypertriglyceridemia    Platelet inhibition due to Plavix    PVD (peripheral vascular disease) (HCC)     Social History   Socioeconomic History   Marital status: Single    Spouse name: Not on file   Number of children: Not on file   Years of education: Not on file   Highest education level: Not on file  Occupational History   Not on file  Tobacco Use   Smoking status: Every Day    Packs/day: 0.25    Types: Cigarettes   Smokeless tobacco: Never  Vaping Use   Vaping  Use: Never used  Substance and Sexual Activity   Alcohol use: No   Drug use: Not Currently    Types: Marijuana, "Crack" cocaine   Sexual activity: Not on file  Other Topics Concern   Not on file  Social History Narrative   Not on file   Social Determinants of Health   Financial Resource Strain: Not on file  Food Insecurity: Not on file  Transportation Needs: Not on file  Physical Activity: Not on file  Stress: Not on file  Social Connections: Not on file  Intimate Partner Violence: Not on file    Past Surgical History:  Procedure Laterality Date   COLONOSCOPY WITH PROPOFOL N/A 10/02/2018   Procedure: COLONOSCOPY WITH PROPOFOL;  Surgeon: Jonathon Bellows, MD;  Location: [facility] ENDOSCOPY;  Service: Gastroenterology;  Laterality: N/A;   ESOPHAGOGASTRODUODENOSCOPY (EGD) WITH PROPOFOL N/A 09/21/2015   Procedure: ESOPHAGOGASTRODUODENOSCOPY (EGD) WITH PROPOFOL;  Surgeon: Josefine Class, MD;  Location: [facility] ENDOSCOPY;  Service: Endoscopy;   Laterality: N/A;   stent lower leg      History reviewed. No pertinent family history.  Allergies  Allergen Reactions   Nortriptyline Other (See Comments)    Pt states it made him have bad dreams Pt states it made him have bad dreams    Duloxetine Other (See Comments)    Bizarre dreams   Morphine And Related Nausea Only   Topiramate Rash    CBC Latest Ref Rng & Units 08/23/2019 08/08/2019 06/27/2019  WBC 4.0 - 10.5 K/uL 7.1 5.1 4.6  Hemoglobin 13.0 - 17.0 g/dL 12.9(L) 13.0 13.3  Hematocrit 39.0 - 52.0 % 40.8 41.2 42.3  Platelets 150 - 400 K/uL 258 291 238      CMP     Component Value Date/Time   NA 138 08/23/2019 1057   NA 139 11/18/2014 0734   K 3.4 (L) 08/23/2019 1057   K 4.3 11/18/2014 0734   CL 104 08/23/2019 1057   CL 106 11/18/2014 0734   CO2 26 08/23/2019 1057   CO2 26 11/18/2014 0734   GLUCOSE 131 (H) 08/23/2019 1057   GLUCOSE 121 (H) 11/18/2014 0734   BUN 15 08/23/2019 1057   BUN 21 (H) 11/18/2014 0734   CREATININE 1.13 08/23/2019 1057   CREATININE 2.01 (H) 11/18/2014 0734   CALCIUM 9.3 08/23/2019 1057   CALCIUM 8.5 11/18/2014 0734   PROT 8.0 07/12/2017 1312   PROT 8.8 (H) 08/01/2013 1250   ALBUMIN 4.0 07/12/2017 1312   ALBUMIN 4.2 08/01/2013 1250   AST 47 (H) 07/12/2017 1312   AST 54 (H) 08/01/2013 1250   ALT 39 07/12/2017 1312   ALT 65 08/01/2013 1250   ALKPHOS 51 07/12/2017 1312   ALKPHOS 86 08/01/2013 1250   BILITOT 1.9 (H) 07/12/2017 1312   BILITOT 0.9 08/01/2013 1250   GFRNONAA >60 08/23/2019 1057   GFRNONAA 38 (L) 11/18/2014 0734   GFRNONAA >60 06/17/2014 0719   GFRAA >60 08/23/2019 1057   GFRAA 46 (L) 11/18/2014 0734   GFRAA >60 06/17/2014 0719     No results found.     Assessment & Plan:   1. Bilateral carotid artery stenosis The patient has noted occlusion of the left ICA.  Noninvasive studies today show a 40 to 59% stenosis of the right ICA however the patient is having visual changes that are concerning for possible amaurosis  fugax.  Based on this we will have a CT scan done to try to evaluate if that level of stenosis is worse than what is  noted on ultrasound.  Highly calcified lesions can shows a lower stenosis on ultrasound versus CT.  We will have the patient follow-up after the scans. - CT ANGIO NECK W OR WO CONTRAST; Future  2. Atherosclerosis of native artery of both lower extremities with intermittent claudication (HCC) Today noninvasive studies show that the patient certainly has evidence of peripheral arterial disease.  Pending his CT scan of his carotid arteries, we will plan on performing an angiogram of his left followed by his right lower extremity.  The patient is experiencing severe claudication.  His left lower extremity is of more concern from currently.  I discussed the procedure as well as the risk, benefits and alternatives.  The patient does agree to proceed with angiogram.  3. Essential hypertension Continue antihypertensive medications as already ordered, these medications have been reviewed and there are no changes at this time.    Current Outpatient Medications on File Prior to Visit  Medication Sig Dispense Refill   albuterol (VENTOLIN HFA) 108 (90 Base) MCG/ACT inhaler Inhale 2 puffs into the lungs every 4 (four) hours as needed for wheezing or shortness of breath. 8 g 0   amLODipine (NORVASC) 10 MG tablet Take 10 mg by mouth daily.     aspirin 81 MG EC tablet Take by mouth.     clopidogrel (PLAVIX) 75 MG tablet Take 75 mg by mouth daily. Held since previous vascular procedure.     clopidogrel (PLAVIX) 75 MG tablet Take by mouth.     diclofenac Sodium (VOLTAREN) 1 % GEL Apply topically.     fenofibrate (TRICOR) 145 MG tablet Take 145 mg by mouth daily.     metoprolol succinate (TOPROL-XL) 25 MG 24 hr tablet Take 12.5 mg by mouth daily.      omeprazole (PRILOSEC) 40 MG capsule Take 40 mg by mouth daily.     omeprazole (PRILOSEC) 40 MG capsule Take by mouth.     pregabalin (LYRICA) 150 MG  capsule Take 150 mg by mouth 2 (two) times daily.     pregabalin (LYRICA) 150 MG capsule Take by mouth.     sildenafil (VIAGRA) 100 MG tablet Take by mouth daily as needed.     aspirin 81 MG chewable tablet Chew by mouth daily. (Patient not taking: Reported on 06/30/2021)     atorvastatin (LIPITOR) 10 MG tablet Take 1 tablet by mouth daily. (Patient not taking: Reported on 06/30/2021)     atorvastatin (LIPITOR) 40 MG tablet Take 40 mg by mouth daily. (Patient not taking: Reported on 06/30/2021)     brompheniramine-pseudoephedrine-DM 30-2-10 MG/5ML syrup Take 10 mLs by mouth 4 (four) times daily as needed. (Patient not taking: Reported on 06/30/2021) 200 mL 0   gemfibrozil (LOPID) 600 MG tablet Take by mouth. (Patient not taking: Reported on 06/30/2021)     lisinopril (ZESTRIL) 20 MG tablet Take by mouth. (Patient not taking: Reported on 06/30/2021)     predniSONE (DELTASONE) 10 MG tablet Take 1 tablet (10 mg total) by mouth daily. (Patient not taking: No sig reported) 42 tablet 0   No current facility-administered medications on file prior to visit.    There are no Patient Instructions on file for this visit. No follow-ups on file.   Kris Hartmann, NP

## 2021-07-02 NOTE — Telephone Encounter (Signed)
I called the pt and left a VM with the NP's instructions.

## 2021-07-02 NOTE — Telephone Encounter (Signed)
Pt should wait for the CT if it is clear they can feel free to but if not they might need treatment per the NP.

## 2021-07-09 ENCOUNTER — Encounter (INDEPENDENT_AMBULATORY_CARE_PROVIDER_SITE_OTHER): Payer: Self-pay

## 2021-07-09 ENCOUNTER — Telehealth (INDEPENDENT_AMBULATORY_CARE_PROVIDER_SITE_OTHER): Payer: Self-pay | Admitting: Nurse Practitioner

## 2021-07-09 ENCOUNTER — Ambulatory Visit
Admission: RE | Admit: 2021-07-09 | Discharge: 2021-07-09 | Disposition: A | Discharge status: Home / Self Care | Payer: Medicaid Other | Source: Ambulatory Visit | Attending: Nurse Practitioner | Admitting: Nurse Practitioner

## 2021-07-09 ENCOUNTER — Other Ambulatory Visit: Payer: Self-pay

## 2021-07-09 DIAGNOSIS — I6523 Occlusion and stenosis of bilateral carotid arteries: Secondary | ICD-10-CM | POA: Insufficient documentation

## 2021-07-09 LAB — POCT I-STAT CREATININE: Creatinine, Ser: 1.1 mg/dL (ref 0.61–1.24)

## 2021-07-09 MED ORDER — IOHEXOL 350 MG/ML SOLN
75.0000 mL | Freq: Once | INTRAVENOUS | Status: AC | PRN
  Administered 2021-07-09: 75 mL via INTRAVENOUS

## 2021-07-09 NOTE — Progress Notes (Signed)
Lets get him scheduled for right carotid stent placement on Thursday with Dr. Lucky Cowboy

## 2021-07-09 NOTE — Progress Notes (Signed)
Patient is schedule for right carotid stent with Dr Lucky Cowboy on 07/15/21 at [facility] arrival time 9:45 am. Covid-19 testing is schedule for 07/13/21 between 8-12 pm at [place]. Pre-procedure instructions were gone over with patient and will be mailed out.

## 2021-07-09 NOTE — Telephone Encounter (Signed)
I was able to contact the patient regarding the CT scan that he had performed earlier today.  The CT scan confirmed that his left ICA was occluded but it also noted that he had high-grade stenosis with near occlusion of his right ICA.  This was starkly different from the 40 to 59% stenosis noted on ultrasound.  It was also noted that this is essentially the only blood vessel perfusing the patient's brain.  Because of this we will schedule a right carotid stent placement next week.  The patient is advised to stay on his Plavix.  He is also advised to try to stop smoking as this is accelerating this process.  The patient is also advised that if he begins to have strokelike symptoms such as sudden facial weakness, weakness of 1 side of his body, speech difficulty, or sudden vision changes or mental status changes he should report to the emergency room immediately.  We discussed the risk, benefits and alternatives.  The patient is agreeable to proceed.  We will see the patient in office following his carotid stent placement.  The patient also has issues with his lower extremities that need to be addressed as well.

## 2021-07-09 NOTE — H&P (View-Only) (Signed)
Lets get him scheduled for right carotid stent placement on Thursday with Dr. Lucky Cowboy

## 2021-07-13 ENCOUNTER — Other Ambulatory Visit
Admission: RE | Admit: 2021-07-13 | Discharge: 2021-07-13 | Disposition: A | Discharge status: Home / Self Care | Payer: Medicaid Other | Source: Ambulatory Visit | Attending: Vascular Surgery | Admitting: Vascular Surgery

## 2021-07-13 ENCOUNTER — Other Ambulatory Visit: Payer: Self-pay

## 2021-07-13 DIAGNOSIS — Z01812 Encounter for preprocedural laboratory examination: Secondary | ICD-10-CM | POA: Insufficient documentation

## 2021-07-13 DIAGNOSIS — Z20822 Contact with and (suspected) exposure to covid-19: Secondary | ICD-10-CM | POA: Insufficient documentation

## 2021-07-13 LAB — SARS CORONAVIRUS 2 (TAT 6-24 HRS): SARS Coronavirus 2: NEGATIVE

## 2021-07-15 ENCOUNTER — Other Ambulatory Visit (INDEPENDENT_AMBULATORY_CARE_PROVIDER_SITE_OTHER): Payer: Self-pay | Admitting: Nurse Practitioner

## 2021-07-15 ENCOUNTER — Encounter
Admission: RE | Disposition: A | Discharge status: Home / Self Care | Payer: Self-pay | Source: Home / Self Care | Attending: Vascular Surgery

## 2021-07-15 ENCOUNTER — Inpatient Hospital Stay
Admission: RE | Admit: 2021-07-15 | Discharge: 2021-07-16 | DRG: 036 | Disposition: A | Discharge status: Home / Self Care | Payer: Medicaid Other | Attending: Vascular Surgery | Admitting: Vascular Surgery

## 2021-07-15 ENCOUNTER — Encounter: Payer: Self-pay | Admitting: Vascular Surgery

## 2021-07-15 ENCOUNTER — Other Ambulatory Visit: Payer: Self-pay

## 2021-07-15 DIAGNOSIS — Z006 Encounter for examination for normal comparison and control in clinical research program: Secondary | ICD-10-CM

## 2021-07-15 DIAGNOSIS — I70213 Atherosclerosis of native arteries of extremities with intermittent claudication, bilateral legs: Secondary | ICD-10-CM | POA: Diagnosis present

## 2021-07-15 DIAGNOSIS — Z7902 Long term (current) use of antithrombotics/antiplatelets: Secondary | ICD-10-CM

## 2021-07-15 DIAGNOSIS — Z885 Allergy status to narcotic agent status: Secondary | ICD-10-CM

## 2021-07-15 DIAGNOSIS — H538 Other visual disturbances: Secondary | ICD-10-CM | POA: Diagnosis present

## 2021-07-15 DIAGNOSIS — I70219 Atherosclerosis of native arteries of extremities with intermittent claudication, unspecified extremity: Secondary | ICD-10-CM | POA: Diagnosis not present

## 2021-07-15 DIAGNOSIS — Z7982 Long term (current) use of aspirin: Secondary | ICD-10-CM | POA: Diagnosis not present

## 2021-07-15 DIAGNOSIS — I10 Essential (primary) hypertension: Secondary | ICD-10-CM | POA: Diagnosis present

## 2021-07-15 DIAGNOSIS — M797 Fibromyalgia: Secondary | ICD-10-CM | POA: Diagnosis present

## 2021-07-15 DIAGNOSIS — Z79899 Other long term (current) drug therapy: Secondary | ICD-10-CM | POA: Diagnosis not present

## 2021-07-15 DIAGNOSIS — E782 Mixed hyperlipidemia: Secondary | ICD-10-CM | POA: Diagnosis present

## 2021-07-15 DIAGNOSIS — Z95820 Peripheral vascular angioplasty status with implants and grafts: Secondary | ICD-10-CM

## 2021-07-15 DIAGNOSIS — K219 Gastro-esophageal reflux disease without esophagitis: Secondary | ICD-10-CM | POA: Diagnosis present

## 2021-07-15 DIAGNOSIS — I6523 Occlusion and stenosis of bilateral carotid arteries: Secondary | ICD-10-CM | POA: Diagnosis present

## 2021-07-15 DIAGNOSIS — Z888 Allergy status to other drugs, medicaments and biological substances status: Secondary | ICD-10-CM | POA: Diagnosis not present

## 2021-07-15 DIAGNOSIS — I6521 Occlusion and stenosis of right carotid artery: Secondary | ICD-10-CM | POA: Diagnosis present

## 2021-07-15 DIAGNOSIS — F17210 Nicotine dependence, cigarettes, uncomplicated: Secondary | ICD-10-CM | POA: Diagnosis present

## 2021-07-15 HISTORY — PX: CAROTID PTA/STENT INTERVENTION: CATH118231

## 2021-07-15 LAB — GLUCOSE, CAPILLARY: Glucose-Capillary: 135 mg/dL — ABNORMAL HIGH (ref 70–99)

## 2021-07-15 LAB — CREATININE, SERUM
Creatinine, Ser: 0.89 mg/dL (ref 0.61–1.24)
GFR, Estimated: >60 mL/min (ref >60)

## 2021-07-15 LAB — POCT ACTIVATED CLOTTING TIME: Activated Clotting Time: 265 seconds

## 2021-07-15 LAB — MRSA NEXT GEN BY PCR, NASAL: MRSA by PCR Next Gen: NOT DETECTED

## 2021-07-15 LAB — BUN: BUN: 15 mg/dL (ref 6–20)

## 2021-07-15 SURGERY — CAROTID PTA/STENT INTERVENTION
Anesthesia: Moderate Sedation | Laterality: Right

## 2021-07-15 MED ORDER — PHENOL 1.4 % MT LIQD
1.0000 | OROMUCOSAL | Status: DC | PRN
  Filled 2021-07-15: qty 177

## 2021-07-15 MED ORDER — CEFAZOLIN SODIUM-DEXTROSE 2-4 GM/100ML-% IV SOLN
2.0000 g | Freq: Three times a day (TID) | INTRAVENOUS | Status: AC
  Administered 2021-07-15 – 2021-07-16 (×2): 2 g via INTRAVENOUS
  Filled 2021-07-15 (×2): qty 100

## 2021-07-15 MED ORDER — HEPARIN SODIUM (PORCINE) 1000 UNIT/ML IJ SOLN
INTRAMUSCULAR | Status: DC | PRN
  Administered 2021-07-15: 8000 [IU] via INTRAVENOUS
  Administered 2021-07-15: 1000 [IU] via INTRAVENOUS

## 2021-07-15 MED ORDER — FAMOTIDINE IN NACL 20-0.9 MG/50ML-% IV SOLN: 20.0000 mg | Freq: Two times a day (BID) | INTRAVENOUS | Status: DC

## 2021-07-15 MED ORDER — IODIXANOL 320 MG/ML IV SOLN
INTRAVENOUS | Status: DC | PRN
  Administered 2021-07-15: 75 mL

## 2021-07-15 MED ORDER — LABETALOL HCL 5 MG/ML IV SOLN: 10.0000 mg | INTRAVENOUS | Status: DC | PRN

## 2021-07-15 MED ORDER — MIDAZOLAM HCL 2 MG/ML PO SYRP: 8.0000 mg | ORAL_SOLUTION | Freq: Once | ORAL | Status: DC | PRN

## 2021-07-15 MED ORDER — CLOPIDOGREL BISULFATE 75 MG PO TABS: 75.0000 mg | ORAL_TABLET | Freq: Every day | ORAL | Status: DC

## 2021-07-15 MED ORDER — PHENYLEPHRINE 40 MCG/ML (10ML) SYRINGE FOR IV PUSH (FOR BLOOD PRESSURE SUPPORT)
PREFILLED_SYRINGE | INTRAVENOUS | Status: AC
  Filled 2021-07-15: qty 10

## 2021-07-15 MED ORDER — ATROPINE SULFATE 1 MG/10ML IJ SOSY
PREFILLED_SYRINGE | INTRAMUSCULAR | Status: AC
  Filled 2021-07-15: qty 10

## 2021-07-15 MED ORDER — ALUM & MAG HYDROXIDE-SIMETH 200-200-20 MG/5ML PO SUSP: 15.0000 mL | ORAL | Status: DC | PRN

## 2021-07-15 MED ORDER — CEFAZOLIN SODIUM-DEXTROSE 2-4 GM/100ML-% IV SOLN
2.0000 g | Freq: Once | INTRAVENOUS | Status: AC
  Administered 2021-07-15: 2 g via INTRAVENOUS

## 2021-07-15 MED ORDER — GUAIFENESIN-DM 100-10 MG/5ML PO SYRP
15.0000 mL | ORAL_SOLUTION | ORAL | Status: DC | PRN
  Filled 2021-07-15: qty 15

## 2021-07-15 MED ORDER — ASPIRIN EC 81 MG PO TBEC
81.0000 mg | DELAYED_RELEASE_TABLET | Freq: Every day | ORAL | Status: DC
  Administered 2021-07-16: 81 mg via ORAL
  Filled 2021-07-15: qty 1

## 2021-07-15 MED ORDER — METOPROLOL TARTRATE 5 MG/5ML IV SOLN: 2.0000 mg | INTRAVENOUS | Status: DC | PRN

## 2021-07-15 MED ORDER — SODIUM CHLORIDE 0.9 % IV SOLN: INTRAVENOUS | Status: DC

## 2021-07-15 MED ORDER — HYDROMORPHONE HCL 1 MG/ML IJ SOLN: 1.0000 mg | Freq: Once | INTRAMUSCULAR | Status: DC | PRN

## 2021-07-15 MED ORDER — PREGABALIN 75 MG PO CAPS
150.0000 mg | ORAL_CAPSULE | Freq: Two times a day (BID) | ORAL | Status: DC
  Administered 2021-07-15 – 2021-07-16 (×2): 150 mg via ORAL
  Filled 2021-07-15 (×2): qty 2

## 2021-07-15 MED ORDER — FENTANYL CITRATE (PF) 100 MCG/2ML IJ SOLN
INTRAMUSCULAR | Status: DC | PRN
  Administered 2021-07-15: 50 ug via INTRAVENOUS
  Administered 2021-07-15: 25 ug via INTRAVENOUS

## 2021-07-15 MED ORDER — ONDANSETRON HCL 4 MG/2ML IJ SOLN: 4.0000 mg | Freq: Four times a day (QID) | INTRAMUSCULAR | Status: DC | PRN

## 2021-07-15 MED ORDER — PHENYLEPHRINE HCL-NACL 20-0.9 MG/250ML-% IV SOLN
INTRAVENOUS | Status: AC
  Filled 2021-07-15: qty 250

## 2021-07-15 MED ORDER — PANTOPRAZOLE SODIUM 40 MG PO TBEC
40.0000 mg | DELAYED_RELEASE_TABLET | Freq: Every day | ORAL | Status: DC
  Administered 2021-07-16: 40 mg via ORAL
  Filled 2021-07-15: qty 1

## 2021-07-15 MED ORDER — MIDAZOLAM HCL 2 MG/2ML IJ SOLN
INTRAMUSCULAR | Status: AC
  Filled 2021-07-15: qty 2

## 2021-07-15 MED ORDER — MIDAZOLAM HCL 2 MG/2ML IJ SOLN
INTRAMUSCULAR | Status: DC | PRN
  Administered 2021-07-15 (×2): 1 mg via INTRAVENOUS

## 2021-07-15 MED ORDER — SODIUM CHLORIDE 0.9 % IV SOLN: 500.0000 mL | Freq: Once | INTRAVENOUS | Status: DC | PRN

## 2021-07-15 MED ORDER — METHYLPREDNISOLONE SODIUM SUCC 125 MG IJ SOLR: 125.0000 mg | Freq: Once | INTRAMUSCULAR | Status: DC | PRN

## 2021-07-15 MED ORDER — CLOPIDOGREL BISULFATE 75 MG PO TABS
75.0000 mg | ORAL_TABLET | Freq: Every day | ORAL | Status: DC
  Administered 2021-07-16: 75 mg via ORAL
  Filled 2021-07-15: qty 1

## 2021-07-15 MED ORDER — FAMOTIDINE 20 MG IN NS 100 ML IVPB
20.0000 mg | Freq: Two times a day (BID) | INTRAVENOUS | Status: DC
  Administered 2021-07-15: 20 mg via INTRAVENOUS
  Filled 2021-07-15: qty 100

## 2021-07-15 MED ORDER — OXYCODONE-ACETAMINOPHEN 5-325 MG PO TABS
1.0000 | ORAL_TABLET | ORAL | Status: DC | PRN
  Administered 2021-07-15: 1 via ORAL
  Filled 2021-07-15: qty 1

## 2021-07-15 MED ORDER — FAMOTIDINE 20 MG PO TABS: 40.0000 mg | ORAL_TABLET | Freq: Once | ORAL | Status: DC | PRN

## 2021-07-15 MED ORDER — POTASSIUM CHLORIDE CRYS ER 20 MEQ PO TBCR: 20.0000 meq | EXTENDED_RELEASE_TABLET | Freq: Every day | ORAL | Status: DC | PRN

## 2021-07-15 MED ORDER — METOPROLOL SUCCINATE ER 25 MG PO TB24
12.5000 mg | ORAL_TABLET | Freq: Every day | ORAL | Status: DC
  Administered 2021-07-16: 12.5 mg via ORAL
  Filled 2021-07-15 (×2): qty 0.5

## 2021-07-15 MED ORDER — FENOFIBRATE 54 MG PO TABS
54.0000 mg | ORAL_TABLET | Freq: Every day | ORAL | Status: DC
  Administered 2021-07-16: 54 mg via ORAL
  Filled 2021-07-15 (×2): qty 1

## 2021-07-15 MED ORDER — ATORVASTATIN CALCIUM 20 MG PO TABS
40.0000 mg | ORAL_TABLET | Freq: Every day | ORAL | Status: DC
  Administered 2021-07-16: 40 mg via ORAL
  Filled 2021-07-15: qty 2

## 2021-07-15 MED ORDER — ACETAMINOPHEN 325 MG RE SUPP
325.0000 mg | RECTAL | Status: DC | PRN
  Filled 2021-07-15: qty 2

## 2021-07-15 MED ORDER — FENTANYL CITRATE (PF) 100 MCG/2ML IJ SOLN
INTRAMUSCULAR | Status: AC
  Filled 2021-07-15: qty 2

## 2021-07-15 MED ORDER — HEPARIN SODIUM (PORCINE) 1000 UNIT/ML IJ SOLN
INTRAMUSCULAR | Status: AC
  Filled 2021-07-15: qty 1

## 2021-07-15 MED ORDER — DIPHENHYDRAMINE HCL 50 MG/ML IJ SOLN: 50.0000 mg | Freq: Once | INTRAMUSCULAR | Status: DC | PRN

## 2021-07-15 MED ORDER — AMLODIPINE BESYLATE 10 MG PO TABS
10.0000 mg | ORAL_TABLET | Freq: Every day | ORAL | Status: DC
  Administered 2021-07-16: 10 mg via ORAL
  Filled 2021-07-15: qty 1

## 2021-07-15 MED ORDER — ATROPINE SULFATE 1 MG/ML IJ SOLN
INTRAMUSCULAR | Status: DC | PRN
  Administered 2021-07-15: 1 mg via INTRAVENOUS

## 2021-07-15 MED ORDER — ACETAMINOPHEN 325 MG PO TABS: 325.0000 mg | ORAL_TABLET | ORAL | Status: DC | PRN

## 2021-07-15 MED ORDER — HYDRALAZINE HCL 20 MG/ML IJ SOLN: 5.0000 mg | INTRAMUSCULAR | Status: DC | PRN

## 2021-07-15 MED ORDER — MAGNESIUM SULFATE 2 GM/50ML IV SOLN
2.0000 g | Freq: Every day | INTRAVENOUS | Status: DC | PRN
Start: 2021-07-15 — End: 2021-07-16
  Filled 2021-07-15: qty 50

## 2021-07-15 SURGICAL SUPPLY — 21 items
BALLN VIATRAC 5X30X135 (BALLOONS) ×2
BALLOON VIATRAC 5X30X135 (BALLOONS) IMPLANT
CATH ANGIO 5F PIGTAIL 100CM (CATHETERS) ×1 IMPLANT
CATH BEACON 5 .035 100 H1 TIP (CATHETERS) ×1 IMPLANT
COVER DRAPE FLUORO 36X44 (DRAPES) ×1 IMPLANT
COVER PROBE U/S 5X48 (MISCELLANEOUS) ×1 IMPLANT
DEVICE EMBOSHIELD NAV6 4.0-7.0 (FILTER) ×1 IMPLANT
DEVICE SAFEGUARD 24CM (GAUZE/BANDAGES/DRESSINGS) ×1 IMPLANT
DEVICE STARCLOSE SE CLOSURE (Vascular Products) ×1 IMPLANT
DEVICE TORQUE .025-.038 (MISCELLANEOUS) ×1 IMPLANT
GLIDEWIRE ANGLED SS 035X260CM (WIRE) ×1 IMPLANT
GUIDEWIRE VASC STIFF .038X260 (WIRE) ×1 IMPLANT
KIT CAROTID MANIFOLD (MISCELLANEOUS) ×1 IMPLANT
KIT ENCORE 26 ADVANTAGE (KITS) ×1 IMPLANT
PACK ANGIOGRAPHY (CUSTOM PROCEDURE TRAY) ×2 IMPLANT
SHEATH BRITE TIP 5FRX11 (SHEATH) ×1 IMPLANT
SHEATH GUIDING CAROTID 6FRX90 (SHEATH) ×1 IMPLANT
STENT XACT CAR 10-8X40X136 (Permanent Stent) ×1 IMPLANT
SYR MEDRAD MARK 7 150ML (SYRINGE) ×1 IMPLANT
VALVE HEMO TOUHY BORST Y (ADAPTER) ×1 IMPLANT
WIRE GUIDERIGHT .035X150 (WIRE) ×1 IMPLANT

## 2021-07-15 NOTE — Interval H&P Note (Signed)
History and Physical Interval Note:  07/15/2021 9:28 AM  Scot Jun  has presented today for surgery, with the diagnosis of RT Carotid Stent  ABBOTT  Carotid artery stenosis.  The various methods of treatment have been discussed with the patient and family. After consideration of risks, benefits and other options for treatment, the patient has consented to  Procedure(s): CAROTID PTA/STENT INTERVENTION (Right) as a surgical intervention.  The patient's history has been reviewed, patient examined, no change in status, stable for surgery.  I have reviewed the patient's chart and labs.  Questions were answered to the patient's satisfaction.     Leotis Pain

## 2021-07-15 NOTE — Op Note (Signed)
OPERATIVE NOTE DATE: 07/15/2021  PROCEDURE:  Ultrasound guidance for vascular access right femoral artery  Placement of a 10 mm proximal, 8 mm distal, 4 cm long exact stent with the use of the NAV-6 embolic protection device in the right carotid artery  PRE-OPERATIVE DIAGNOSIS: 1.  High-grade right carotid artery stenosis. 2.  Left carotid artery occlusion 3.  Atherosclerosis with claudication  POST-OPERATIVE DIAGNOSIS:  Same as above  SURGEON: Leotis Pain, MD  ASSISTANT(S): None  ANESTHESIA: local/MCS  ESTIMATED BLOOD LOSS: 15 cc  CONTRAST: 75 cc  FLUORO TIME: 3.6 minutes  MODERATE CONSCIOUS SEDATION TIME:  Approximately 37 minutes using 2 mg of Versed and 75 mcg of Fentanyl  FINDING(S): 1.   95% right carotid artery stenosis  SPECIMEN(S):   none  INDICATIONS:   Patient is a 55 y.o. male who presents with high-grade right carotid artery stenosis.  The patient has a left carotid artery occlusion and carotid artery stenting was felt to be preferred to endarterectomy for that reason.  Risks and benefits were discussed and informed consent was obtained.   DESCRIPTION: After obtaining full informed written consent, the patient was brought back to the vascular suite and placed supine upon the table.  The patient received IV antibiotics prior to induction. Moderate conscious sedation was administered during a face to face encounter with the patient throughout the procedure with my supervision of the RN administering medicines and monitoring the patients vital signs and mental status throughout from the start of the procedure until the patient was taken to the recovery room.  After obtaining adequate anesthesia, the patient was prepped and draped in the standard fashion.   The right femoral artery was visualized with ultrasound and found to be widely patent. It was then accessed under direct ultrasound guidance without difficulty with a Seldinger needle. A permanent image was  recorded. A J-wire was placed and we then placed a 6 French sheath. The patient was then heparinized and a total of 9000 units of intravenous heparin were given and an ACT was checked to confirm successful anticoagulation. A pigtail catheter was then placed into the ascending aorta. This showed normal origins of the great vessels although possibly a bovine configuration.  No proximal stenosis in the great vessels. I then selectively cannulated the innominate artery without difficulty with a headhunter catheter and advanced into the mid right common carotid artery.  Cervical and cerebral carotid angiography was then performed. There were no obvious intracranial filling defects with brisk right to left cross-filling which was complete including the anterior and middle cerebral arteries. The carotid bifurcation demonstrated a very high-grade stenosis in the proximal right internal carotid artery in the 95% range that was somewhat ulcerative as well.  I then advanced into the external carotid artery with a Glidewire and the headhunter catheter and then exchanged for the Amplatz Super Stiff wire. Over the Amplatz Super Stiff wire, a 6 French destination sheath was placed into the mid common carotid artery. I then used the NAV-6  Embolic protection device and crossed the lesion and parked this in the distal internal carotid artery at the base of the skull.  The patient was pretreated with atropine due to heart rates in the low 60s.  I then selected a 10 mm proximal, 8 mm distal 4 cm long exact stent. This was deployed across the lesion encompassing it in its entirety. A 5 mm diameter by 3 cm length balloon was used to post dilate the stent. Only about a  20% residual stenosis was present after angioplasty. Completion angiogram showed normal intracranial filling without new defects. At this point I elected to terminate the procedure. The sheath was removed and StarClose closure device was deployed in the right femoral  artery with excellent hemostatic result. The patient was taken to the recovery room in stable condition having tolerated the procedure well.  COMPLICATIONS: none  CONDITION: stable  Leotis Pain 07/15/2021 1:15 PM   This note was created with Dragon Medical transcription system. Any errors in dictation are purely unintentional.

## 2021-07-16 DIAGNOSIS — I6521 Occlusion and stenosis of right carotid artery: Secondary | ICD-10-CM | POA: Diagnosis not present

## 2021-07-16 LAB — CBC
HCT: 38.6 % — ABNORMAL LOW (ref 39.0–52.0)
Hemoglobin: 12.5 g/dL — ABNORMAL LOW (ref 13.0–17.0)
MCH: 29.3 pg (ref 26.0–34.0)
MCHC: 32.4 g/dL (ref 30.0–36.0)
MCV: 90.4 fL (ref 80.0–100.0)
Platelets: 204 10*3/uL (ref 150–400)
RBC: 4.27 MIL/uL (ref 4.22–5.81)
RDW: 12.6 % (ref 11.5–15.5)
WBC: 4.7 10*3/uL (ref 4.0–10.5)
nRBC: 0 % (ref 0.0–0.2)

## 2021-07-16 LAB — BASIC METABOLIC PANEL
Anion gap: 8 (ref 5–15)
BUN: 15 mg/dL (ref 6–20)
CO2: 27 mmol/L (ref 22–32)
Calcium: 8.7 mg/dL — ABNORMAL LOW (ref 8.9–10.3)
Chloride: 101 mmol/L (ref 98–111)
Creatinine, Ser: 1.11 mg/dL (ref 0.61–1.24)
GFR, Estimated: >60 mL/min (ref >60)
Glucose, Bld: 146 mg/dL — ABNORMAL HIGH (ref 70–99)
Potassium: 3.9 mmol/L (ref 3.5–5.1)
Sodium: 136 mmol/L (ref 135–145)

## 2021-07-16 MED ORDER — VARENICLINE TARTRATE 0.5 MG X 11 & 1 MG X 42 PO MISC: ORAL | 0 refills | Status: AC

## 2021-07-16 MED ORDER — OXYCODONE-ACETAMINOPHEN 5-325 MG PO TABS: 1.0000 | ORAL_TABLET | Freq: Four times a day (QID) | ORAL | 0 refills | Status: AC | PRN

## 2021-07-16 MED ORDER — NICOTINE 21 MG/24HR TD PT24: 21.0000 mg | MEDICATED_PATCH | Freq: Every day | TRANSDERMAL | 0 refills | Status: AC

## 2021-07-16 MED ORDER — ASPIRIN 81 MG PO TBEC: 81.0000 mg | DELAYED_RELEASE_TABLET | Freq: Every day | ORAL | 3 refills | Status: AC

## 2021-07-16 MED ORDER — FAMOTIDINE 20 MG PO TABS: 20.0000 mg | ORAL_TABLET | Freq: Two times a day (BID) | ORAL | Status: DC

## 2021-07-16 MED ORDER — ATORVASTATIN CALCIUM 10 MG PO TABS: 10.0000 mg | ORAL_TABLET | Freq: Every day | ORAL | 3 refills | Status: DC

## 2021-07-16 NOTE — Discharge Summary (Signed)
[facility]    Discharge Summary  Patient ID:  Ricky King MRN: [id number] DOB/AGE: 1966-07-16 55 y.o.  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 male who presents with high-grade right carotid artery stenosis.  The patient has a left carotid artery occlusion and carotid artery stenting was felt to be preferred to endarterectomy for that reason.  Risks and benefits were discussed and informed consent was obtained. On 07/15/21:   Ultrasound guidance for vascular access right femoral artery  Placement of a 10 mm proximal, 8 mm distal, 4 cm long exact stent with the use of the NAV-6 embolic protection device in the right carotid artery  The patient tolerated the procedure and is transferred from the angiography suite to the ICU for observation overnight.  Patient's site of surgery was unremarkable.  During his brief stay, his diet was advanced, his pain was controlled to the use of p.o. pain medication, he was urinating on his own and ambulating at baseline.  Day of discharge, the patient was afebrile with stable  vital signs and essentially unremarkable exam.  Physical Exam:  Alert and oriented x3, no acute distress Face: Symmetrical, tongue midline Neck: Trachea midline Cardiovascular: Regular rate and rhythm Pulmonary: Clear to auscultation bilaterally Abdomen: Soft, nontender, nondistended Access site: Clean dry intact.  No swelling or drainage noted. Vascular: Lower extremities warm distally to toes.  Minimal edema. Neurological: No deficits noted  Labs: As below  Complications: None  Consults: None  Significant Diagnostic Studies: CBC Lab Results  Component Value Date   WBC 4.7 07/16/2021   HGB 12.5 (L) 07/16/2021   HCT 38.6 (L) 07/16/2021   MCV 90.4 07/16/2021   PLT 204 07/16/2021   BMET    Component Value Date/Time   NA 136 07/16/2021 0550   NA 139 11/18/2014 0734   K 3.9 07/16/2021 0550   K 4.3 11/18/2014 0734   CL 101 07/16/2021 0550   CL 106 11/18/2014 0734   CO2 27 07/16/2021 0550   CO2 26 11/18/2014 0734   GLUCOSE 146 (H) 07/16/2021 0550   GLUCOSE 121 (H) 11/18/2014 0734   BUN 15 07/16/2021 0550   BUN 21 (H) 11/18/2014 0734   CREATININE 1.11 07/16/2021 0550   CREATININE 2.01 (H) 11/18/2014 0734   CALCIUM 8.7 (L) 07/16/2021 0550   CALCIUM 8.5 11/18/2014 0734   GFRNONAA >60 07/16/2021 0550   GFRNONAA 38 (L) 11/18/2014 0734   GFRNONAA >60 06/17/2014 0719   GFRAA >60 08/23/2019 1057   GFRAA 46 (L) 11/18/2014 0734   GFRAA >60 06/17/2014 0719   COAG Lab Results  Component Value  Date   INR 1.2 08/23/2019   INR 1.0 08/08/2019   INR 1.0 06/27/2019   Disposition:  Discharge to :Home  Allergies as of 07/16/2021       Reactions   Nortriptyline Other (See Comments)   Pt states it made him have bad dreams Pt states it made him have bad dreams   Duloxetine Other (See Comments)   Bizarre dreams   Morphine And Related Nausea Only   Topiramate Rash        Medication List     STOP taking these medications    albuterol 108 (90 Base) MCG/ACT  inhaler Commonly known as: VENTOLIN HFA   amLODipine 10 MG tablet Commonly known as: NORVASC   atorvastatin 10 MG tablet Commonly known as: LIPITOR   atorvastatin 40 MG tablet Commonly known as: LIPITOR   brompheniramine-pseudoephedrine-DM 30-2-10 MG/5ML syrup   diclofenac Sodium 1 % Gel Commonly known as: VOLTAREN   predniSONE 10 MG tablet Commonly known as: DELTASONE   sildenafil 100 MG tablet Commonly known as: VIAGRA       TAKE these medications    aspirin 81 MG EC tablet Take 1 tablet (81 mg total) by mouth daily. What changed:  how much to take when to take this Another medication with the same name was removed. Continue taking this medication, and follow the directions you see here.   clopidogrel 75 MG tablet Commonly known as: PLAVIX Take 75 mg by mouth daily. Held since previous vascular procedure. What changed: Another medication with the same name was removed. Continue taking this medication, and follow the directions you see here.   fenofibrate 145 MG tablet Commonly known as: TRICOR Take 145 mg by mouth daily.   gemfibrozil 600 MG tablet Commonly known as: LOPID Take by mouth.   lisinopril 20 MG tablet Commonly known as: ZESTRIL Take by mouth.   metoprolol succinate 25 MG 24 hr tablet Commonly known as: TOPROL-XL Take 12.5 mg by mouth daily.   nicotine 21 mg/24hr patch Commonly known as: NICODERM CQ - dosed in mg/24 hours Place 1 patch (21 mg total) onto the skin daily.   omeprazole 40 MG capsule Commonly known as: PRILOSEC Take 40 mg by mouth daily. What changed: Another medication with the same name was removed. Continue taking this medication, and follow the directions you see here.   oxyCODONE-acetaminophen 5-325 MG tablet Commonly known as: PERCOCET/ROXICET Take 1 tablet by mouth every 6 (six) hours as needed for moderate pain.   pregabalin 150 MG capsule Commonly known as: LYRICA Take 150 mg by mouth 2 (two) times daily. What  changed: Another medication with the same name was removed. Continue taking this medication, and follow the directions you see here.   varenicline 0.5 MG X 11 & 1 MG X 42 tablet Commonly known as: CHANTIX PAK Take one 0.5 mg tablet by mouth once daily for 3 days, then increase to one 0.5 mg tablet twice daily for 4 days, then increase to one 1 mg tablet twice daily.       Verbal and written Discharge instructions given to the patient. Wound care per Discharge AVS  Follow-up Information     Dew, Erskine Squibb, MD Follow up in 1 month(s).   Specialties: Vascular Surgery, Radiology, Interventional Cardiology Why: Can see Dew or Arna Medici. First post-op. Needs carotid with follow up. Contact information: [place] [address] [phone]                Signed: Joelene Millin  A Hue Steveson, PA-C 07/16/2021, 12:27 PM

## 2021-07-16 NOTE — Progress Notes (Signed)
Left Message [phone]. Patient needs follow up appointment with Dr. Lucky Cowboy or Arna Medici in 1 month. Left message for office to call patient to schedule appointment. Also gave patients office information for appointment. Patient being discharged home.

## 2021-07-16 NOTE — Plan of Care (Signed)
Patient being discharged

## 2021-07-16 NOTE — Progress Notes (Signed)
Dr. Lucky Cowboy into see patient. Right groin intact no bleeding or hematoma. Doppler bilateral pulses. Patient states he has weakness in both legs. Dr. Lucky Cowboy aware, MD states its patients baseline. At this time MD states no need for PT evaluation. Patient tolerating diet and using urinal with no complications. Patient will be discharged home today. Awaiting for orders. Continue to assess.

## 2021-07-16 NOTE — Discharge Instructions (Signed)
Vascular Surgery Discharge Instructions:  1) You may shower as of tomorrow.  Gently clean your groins with soap and water.  Gently pat dry. 2) Please do not engage in strenuous activity or lifting greater than 10 pounds until you are cleared at your first postoperative follow-up. 3) Please do not drive for 2 weeks.

## 2021-07-21 NOTE — Interval H&P Note (Signed)
History and Physical Interval Note:  07/21/2021 11:36 AM  Ricky King  has presented today for surgery, with the diagnosis of RT Carotid Stent  ABBOTT  Carotid artery stenosis.  The various methods of treatment have been discussed with the patient and family. After consideration of risks, benefits and other options for treatment, the patient has consented to  Procedure(s): CAROTID PTA/STENT INTERVENTION (Right) as a surgical intervention.  The patient's history has been reviewed, patient examined, no change in status, stable for surgery.  I have reviewed the patient's chart and labs.  Questions were answered to the patient's satisfaction.     Leotis Pain

## 2021-10-04 ENCOUNTER — Other Ambulatory Visit (INDEPENDENT_AMBULATORY_CARE_PROVIDER_SITE_OTHER): Payer: Self-pay | Admitting: Vascular Surgery

## 2021-10-04 DIAGNOSIS — I6529 Occlusion and stenosis of unspecified carotid artery: Secondary | ICD-10-CM

## 2021-10-05 ENCOUNTER — Ambulatory Visit (INDEPENDENT_AMBULATORY_CARE_PROVIDER_SITE_OTHER): Payer: Medicaid Other | Admitting: Vascular Surgery

## 2021-10-05 ENCOUNTER — Ambulatory Visit (INDEPENDENT_AMBULATORY_CARE_PROVIDER_SITE_OTHER): Payer: Medicaid Other

## 2021-10-05 ENCOUNTER — Other Ambulatory Visit: Payer: Self-pay

## 2021-10-05 ENCOUNTER — Encounter (INDEPENDENT_AMBULATORY_CARE_PROVIDER_SITE_OTHER): Payer: Self-pay | Admitting: Vascular Surgery

## 2021-10-05 VITALS — BP 185/109 | HR 73 | Resp 16 | Wt 203.0 lb

## 2021-10-05 DIAGNOSIS — F17200 Nicotine dependence, unspecified, uncomplicated: Secondary | ICD-10-CM | POA: Insufficient documentation

## 2021-10-05 DIAGNOSIS — E785 Hyperlipidemia, unspecified: Secondary | ICD-10-CM

## 2021-10-05 DIAGNOSIS — I70213 Atherosclerosis of native arteries of extremities with intermittent claudication, bilateral legs: Secondary | ICD-10-CM

## 2021-10-05 DIAGNOSIS — I6529 Occlusion and stenosis of unspecified carotid artery: Secondary | ICD-10-CM | POA: Diagnosis not present

## 2021-10-05 DIAGNOSIS — I10 Essential (primary) hypertension: Secondary | ICD-10-CM

## 2021-10-05 DIAGNOSIS — I6521 Occlusion and stenosis of right carotid artery: Secondary | ICD-10-CM

## 2021-10-05 NOTE — Assessment & Plan Note (Signed)

## 2021-10-05 NOTE — Assessment & Plan Note (Signed)
The patient is 2 to 23-month status post right carotid stent placement for high-grade stenosis with contralateral occlusion and severe vertebral disease as well.  He has done well.  He continues to smoke and we again stressed the importance of smoking cessation.  He will continue aspirin and Plavix.  Return in 3 months with duplex.

## 2021-10-05 NOTE — Progress Notes (Signed)
Patient ID: Ricky King, male   DOB: April 11, 1966, 55 y.o.   MRN: [id number]  Chief Complaint  Patient presents with   Follow-up   Routine Post Op    [facility] 1 month post carotid stent    HPI Ricky King is a 55 y.o. male.  Patient returns in follow-up of his carotid stent.  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  He has done reasonably well but he continues to smoke.  He denies any focal neurologic symptoms since his last visit.  He does have a previous history of stroke.  No perioperative complications.  Duplex today shows his right carotid stent to be widely patent.  Left carotid occlusion is chronic.   Past Medical History:  Diagnosis Date   Benign essential hypertension    Chest pain    Chronic hepatitis C without hepatic coma ([facility])    treated    Fibromyalgia    GERD (gastroesophageal reflux disease)    Hyperlipidemia, mixed    Hypertriglyceridemia    Platelet inhibition due to Plavix    PVD (peripheral vascular disease) [facility])     Past Surgical History:  Procedure Laterality Date   CAROTID PTA/STENT INTERVENTION Right 07/15/2021   Procedure: CAROTID PTA/STENT INTERVENTION;  Surgeon: Algernon Huxley, MD;  Location: [facility];  Service: Cardiovascular;  Laterality: Right;   COLONOSCOPY WITH PROPOFOL N/A 10/02/2018   Procedure: COLONOSCOPY WITH PROPOFOL;  Surgeon: Jonathon Bellows, MD;  [facility] ENDOSCOPY;  Service: Gastroenterology;  Laterality: N/A;   ESOPHAGOGASTRODUODENOSCOPY (EGD) WITH PROPOFOL N/A 09/21/2015   Procedure: ESOPHAGOGASTRODUODENOSCOPY (EGD) WITH PROPOFOL;  Surgeon: Josefine Class, MD;  Location: [facility] ENDOSCOPY;  Service: Endoscopy;  Laterality: N/A;   stent lower leg        Allergies  Allergen Reactions   Nortriptyline Other (See Comments)    Pt states it made him have bad dreams Pt states it made  him have bad dreams    Duloxetine Other (See Comments)    Bizarre dreams   Morphine And Related Nausea Only   Topiramate Rash    Current Outpatient Medications  Medication Sig Dispense Refill   aspirin 81 MG EC tablet Take 1 tablet (81 mg total) by mouth daily. 90 tablet 3   clopidogrel (PLAVIX) 75 MG tablet Take 75 mg by mouth daily. Held since previous vascular procedure.     fenofibrate (TRICOR) 145 MG tablet Take 145 mg by mouth daily.     metoprolol succinate (TOPROL-XL) 25 MG 24 hr tablet Take 12.5 mg by mouth daily.      omeprazole (PRILOSEC) 40 MG capsule Take 40 mg by mouth daily.     pregabalin (LYRICA) 150 MG capsule Take 150 mg by mouth 2 (two) times daily.     gemfibrozil (LOPID) 600 MG tablet Take by mouth. (Patient not taking: Reported on 06/30/2021)     lisinopril (ZESTRIL) 20 MG tablet Take by mouth. (Patient not taking: Reported on 06/30/2021)     nicotine (NICODERM CQ - DOSED IN MG/24 HOURS) 21 mg/24hr patch Place 1 patch (21 mg total) onto the skin daily. (Patient not taking: Reported on 10/05/2021) 28 patch 0   oxyCODONE-acetaminophen (PERCOCET/ROXICET) 5-325 MG tablet Take 1 tablet by mouth every 6 (six) hours as needed for moderate pain. (Patient not taking: Reported on 10/05/2021) 10 tablet 0   varenicline (CHANTIX  PAK) 0.5 MG X 11 & 1 MG X 42 tablet Take one 0.5 mg tablet by mouth once daily for 3 days, then increase to one 0.5 mg tablet twice daily for 4 days, then increase to one 1 mg tablet twice daily. (Patient not taking: Reported on 10/05/2021) 53 tablet 0   No current facility-administered medications for this visit.        Physical Exam BP (!) 185/109 (BP Location: Left Arm)    Pulse 73    Resp 16    Wt 203 lb (92.1 kg)    BMI 31.33 kg/m  Gen:  WD/WN, NAD Skin: incision C/D/I     Assessment/Plan: Essential hypertension lipid control important in reducing the progression of atherosclerotic disease. Continue statin therapy      Hyperlipidemia lipid control important in reducing the progression of atherosclerotic disease. Continue statin therapy  Atherosclerosis of native arteries of extremity with intermittent claudication ([place]) Still has significant claudication symptoms.  We will check ABIs at his next visit.  No immediate limb threatening symptoms requiring immediate intervention.  Carotid stenosis, right The patient is 2 to 56-month status post right carotid stent placement for high-grade stenosis with contralateral occlusion and severe vertebral disease as well.  He has done well.  He continues to smoke and we again stressed the importance of smoking cessation.  He will continue aspirin and Plavix.  Return in 3 months with duplex.  Tobacco use disorder We had a discussion for approximately 3 minutes regarding the absolute need for smoking cessation due to the deleterious nature of tobacco on the vascular system. We discussed the tobacco use would diminish patency of any intervention, and likely significantly worsen progressio of disease. We discussed multiple agents for quitting including replacement therapy or medications to reduce cravings such as Chantix. The patient voices their understanding of the importance of smoking cessation.      Leotis Pain 10/05/2021, 9:42 AM   This note was created with Dragon medical transcription system.  Any errors from dictation are unintentional.

## 2022-01-21 ENCOUNTER — Ambulatory Visit (INDEPENDENT_AMBULATORY_CARE_PROVIDER_SITE_OTHER): Payer: Medicaid Other | Admitting: Vascular Surgery

## 2022-01-21 ENCOUNTER — Ambulatory Visit (INDEPENDENT_AMBULATORY_CARE_PROVIDER_SITE_OTHER): Payer: Medicaid Other

## 2022-01-21 ENCOUNTER — Ambulatory Visit (INDEPENDENT_AMBULATORY_CARE_PROVIDER_SITE_OTHER): Payer: Medicaid Other | Admitting: Nurse Practitioner

## 2022-01-21 ENCOUNTER — Encounter (INDEPENDENT_AMBULATORY_CARE_PROVIDER_SITE_OTHER): Payer: Self-pay | Admitting: Nurse Practitioner

## 2022-01-21 VITALS — BP 150/83 | HR 65 | Resp 17 | Ht 67.5 in | Wt 203.2 lb

## 2022-01-21 DIAGNOSIS — I70213 Atherosclerosis of native arteries of extremities with intermittent claudication, bilateral legs: Secondary | ICD-10-CM | POA: Diagnosis not present

## 2022-01-21 DIAGNOSIS — F17200 Nicotine dependence, unspecified, uncomplicated: Secondary | ICD-10-CM

## 2022-01-21 DIAGNOSIS — I6521 Occlusion and stenosis of right carotid artery: Secondary | ICD-10-CM | POA: Diagnosis not present

## 2022-01-21 DIAGNOSIS — I6523 Occlusion and stenosis of bilateral carotid arteries: Secondary | ICD-10-CM

## 2022-01-21 DIAGNOSIS — I10 Essential (primary) hypertension: Secondary | ICD-10-CM

## 2022-01-23 ENCOUNTER — Encounter (INDEPENDENT_AMBULATORY_CARE_PROVIDER_SITE_OTHER): Payer: Self-pay | Admitting: Nurse Practitioner

## 2022-01-23 NOTE — Progress Notes (Signed)
? ?Subjective:  ? ? Patient ID: Ricky King, male    DOB: 09/28/1966, 56 y.o.   MRN: [id number] ?Chief Complaint  ?Patient presents with  ? Follow-up  ?  Ultrasound  ? ? ?Ricky King is a 56 year old male that presents today for follow-up of his right carotid stent placement as well as his peripheral arterial disease.  He denies any amaurosis fugax, TIA or CVA-like symptoms.  He has some claudication-like symptoms but they only happen after a mile and a half or so.  Otherwise they are minimal.  He denies any rest pain or ulceration.  He had a right carotid stent placed approximately 6 months ago.  He has been taking his medication as prescribed.  The patient has a known occluded left ICA with a 1 to 39% stenosis of the right ICA with a widely patent stent. ? ?The patient has a right ABI 0.9 on the left is 0.84.  This shows improvement from his previous studies.  He has biphasic tibial artery waveforms on the right and monophasic on the left with good toe waveforms bilaterally. ? ? ?Review of Systems  ?Eyes:  Negative for visual disturbance.  ?Cardiovascular:   ?     No claudication  ?All other systems reviewed and are negative. ? ?   ?Objective:  ? Physical Exam ? ?BP (!) 150/83 (BP Location: Right Arm)   Pulse 65   Resp 17   Ht 5' 7.5" (1.715 m)   Wt 203 lb 3.2 oz (92.2 kg)   BMI 31.36 kg/m?  ? ?Past Medical History:  ?Diagnosis Date  ? Benign essential hypertension   ? Chest pain   ? Chronic hepatitis C without hepatic coma (HCC)   ? treated   ? Fibromyalgia   ? GERD (gastroesophageal reflux disease)   ? Hyperlipidemia, mixed   ? Hypertriglyceridemia   ? Platelet inhibition due to Plavix   ? PVD (peripheral vascular disease) ([place])   ? ? ?Social History  ? ?Socioeconomic History  ? Marital status: Single  ?  Spouse name: Not on file  ? Number of children: Not on file  ? Years of education: Not on file  ? Highest education level: Not on file  ?Occupational History  ? Not on file  ?Tobacco Use  ? Smoking  status: Some Days  ?  Packs/day: 0.25  ?  Types: Cigarettes  ? Smokeless tobacco: Never  ? Tobacco comments:  ?  No cigarettes in about 3 weeks as of 07/15/21  ?Vaping Use  ? Vaping Use: Never used  ?Substance and Sexual Activity  ? Alcohol use: No  ? Drug use: Not Currently  ?  Types: Marijuana, "Crack" cocaine  ? Sexual activity: Not on file  ?Other Topics Concern  ? Not on file  ?Social History Narrative  ? Not on file  ? ?Social Determinants of Health  ? ?Financial Resource Strain: Not on file  ?Food Insecurity: Not on file  ?Transportation Needs: Not on file  ?Physical Activity: Not on file  ?Stress: Not on file  ?Social Connections: Not on file  ?Intimate Partner Violence: Not on file  ? ? ?Past Surgical History:  ?Procedure Laterality Date  ? CAROTID PTA/STENT INTERVENTION Right 07/15/2021  ? Procedure: CAROTID PTA/STENT INTERVENTION;  Surgeon: Algernon Huxley, MD;  Location: [facility];  Service: Cardiovascular;  Laterality: Right;  ? COLONOSCOPY WITH PROPOFOL N/A 10/02/2018  ? Procedure: COLONOSCOPY WITH PROPOFOL;  Surgeon: Jonathon Bellows, MD;  Location: [facility] ENDOSCOPY;  Service: Gastroenterology;  Laterality: N/A;  ? ESOPHAGOGASTRODUODENOSCOPY (EGD) WITH PROPOFOL N/A 09/21/2015  ? Procedure: ESOPHAGOGASTRODUODENOSCOPY (EGD) WITH PROPOFOL;  Surgeon: Josefine Class, MD;  Location[facility] ENDOSCOPY;  Service: Endoscopy;  Laterality: N/A;  ? stent lower leg    ? ? ?History reviewed. No pertinent family history. ? ?Allergies  ?Allergen Reactions  ? Nortriptyline Other (See Comments)  ?  Pt states it made him have bad dreams ?Pt states it made him have bad dreams ?  ? Duloxetine Other (See Comments)  ?  Bizarre dreams  ? Morphine And Related Nausea Only  ? Topiramate Rash  ? ? ? ?  Latest Ref Rng & Units 07/16/2021  ?  5:50 AM 08/23/2019  ? 10:57 AM 08/08/2019  ? 10:34 AM  ?CBC  ?WBC 4.0 - 10.5 K/uL 4.7   7.1   5.1    ?Hemoglobin 13.0 - 17.0 g/dL 12.5   12.9   13.0    ?Hematocrit 39.0 - 52.0 % 38.6   40.8    41.2    ?Platelets 150 - 400 K/uL 204   258   291    ? ? ? ? ?CMP  ?   ?Component Value Date/Time  ? NA 136 07/16/2021 0550  ? NA 139 11/18/2014 0734  ? K 3.9 07/16/2021 0550  ? K 4.3 11/18/2014 0734  ? CL 101 07/16/2021 0550  ? CL 106 11/18/2014 0734  ? CO2 27 07/16/2021 0550  ? CO2 26 11/18/2014 0734  ? GLUCOSE 146 (H) 07/16/2021 0550  ? GLUCOSE 121 (H) 11/18/2014 0734  ? BUN 15 07/16/2021 0550  ? BUN 21 (H) 11/18/2014 0734  ? CREATININE 1.11 07/16/2021 0550  ? CREATININE 2.01 (H) 11/18/2014 0734  ? CALCIUM 8.7 (L) 07/16/2021 0550  ? CALCIUM 8.5 11/18/2014 0734  ? PROT 8.0 07/12/2017 1312  ? PROT 8.8 (H) 08/01/2013 1250  ? ALBUMIN 4.0 07/12/2017 1312  ? ALBUMIN 4.2 08/01/2013 1250  ? AST 47 (H) 07/12/2017 1312  ? AST 54 (H) 08/01/2013 1250  ? ALT 39 07/12/2017 1312  ? ALT 65 08/01/2013 1250  ? ALKPHOS 51 07/12/2017 1312  ? ALKPHOS 86 08/01/2013 1250  ? BILITOT 1.9 (H) 07/12/2017 1312  ? BILITOT 0.9 08/01/2013 1250  ? GFRNONAA >60 07/16/2021 0550  ? GFRNONAA 38 (L) 11/18/2014 0734  ? GFRNONAA >60 06/17/2014 0719  ? GFRAA >60 08/23/2019 1057  ? GFRAA 46 (L) 11/18/2014 0734  ? GFRAA >60 06/17/2014 0719  ? ? ? ?No results found. ? ?   ?Assessment & Plan:  ? ?1. Atherosclerosis of native artery of both lower extremities with intermittent claudication ([place]) ? Recommend: ? ?The patient has evidence of atherosclerosis of the lower extremities with claudication.  The patient does not voice lifestyle limiting changes at this point in time. ? ?Noninvasive studies do not suggest clinically significant change. ? ?No invasive studies, angiography or surgery at this time ?The patient should continue walking and begin a more formal exercise program.  ?The patient should continue antiplatelet therapy and aggressive treatment of the lipid abnormalities ? ?No changes in the patient's medications at this time ? ?The patient should continue wearing graduated compression socks 10-15 mmHg strength to control the mild edema.   ? ?2.  Bilateral carotid artery stenosis ?Recommend: ? ?Given the patient's asymptomatic subcritical stenosis no further invasive testing or surgery at this time. ? ?Duplex ultrasound shows 1-39% stenosis of the right ICA with left occlusion ? ?Continue antiplatelet therapy as prescribed ?Continue management  of CAD, HTN and Hyperlipidemia ?Healthy heart diet,  encouraged exercise at least 4 times per week ?Follow up in 6 months with duplex ultrasound and physical exam   ? ?3. Tobacco use disorder ?Smoking cessation was discussed, 3-10 minutes spent on this topic specifically  ? ?4. Essential hypertension ?Continue antihypertensive medications as already ordered, these medications have been reviewed and there are no changes at this time.  ? ? ?Current Outpatient Medications on File Prior to Visit  ?Medication Sig Dispense Refill  ? aspirin 81 MG EC tablet Take 1 tablet (81 mg total) by mouth daily. 90 tablet 3  ? clopidogrel (PLAVIX) 75 MG tablet Take 75 mg by mouth daily. Held since previous vascular procedure.    ? fenofibrate (TRICOR) 145 MG tablet Take 145 mg by mouth daily.    ? losartan (COZAAR) 25 MG tablet Take 25 mg by mouth daily.    ? metoprolol succinate (TOPROL-XL) 25 MG 24 hr tablet Take 12.5 mg by mouth daily.     ? omeprazole (PRILOSEC) 40 MG capsule Take 40 mg by mouth daily.    ? pregabalin (LYRICA) 150 MG capsule Take 150 mg by mouth 2 (two) times daily.    ? gemfibrozil (LOPID) 600 MG tablet Take by mouth. (Patient not taking: Reported on 06/30/2021)    ? lisinopril (ZESTRIL) 20 MG tablet Take by mouth. (Patient not taking: Reported on 06/30/2021)    ? nicotine (NICODERM CQ - DOSED IN MG/24 HOURS) 21 mg/24hr patch Place 1 patch (21 mg total) onto the skin daily. (Patient not taking: Reported on 10/05/2021) 28 patch 0  ? oxyCODONE-acetaminophen (PERCOCET/ROXICET) 5-325 MG tablet Take 1 tablet by mouth every 6 (six) hours as needed for moderate pain. (Patient not taking: Reported on 10/05/2021) 10 tablet 0  ?  varenicline (CHANTIX PAK) 0.5 MG X 11 & 1 MG X 42 tablet Take one 0.5 mg tablet by mouth once daily for 3 days, then increase to one 0.5 mg tablet twice daily for 4 days, then increase to one 1 mg tablet twice

## 2022-03-18 ENCOUNTER — Emergency Department: Payer: Medicaid Other

## 2022-03-18 ENCOUNTER — Other Ambulatory Visit: Payer: Self-pay

## 2022-03-18 ENCOUNTER — Emergency Department
Admission: EM | Admit: 2022-03-18 | Discharge: 2022-03-18 | Disposition: A | Discharge status: Home / Self Care | Payer: Medicaid Other | Attending: Emergency Medicine | Admitting: Emergency Medicine

## 2022-03-18 ENCOUNTER — Encounter: Payer: Self-pay | Admitting: Intensive Care

## 2022-03-18 DIAGNOSIS — R200 Anesthesia of skin: Secondary | ICD-10-CM | POA: Insufficient documentation

## 2022-03-18 DIAGNOSIS — R930 Abnormal findings on diagnostic imaging of skull and head, not elsewhere classified: Secondary | ICD-10-CM

## 2022-03-18 DIAGNOSIS — H538 Other visual disturbances: Secondary | ICD-10-CM | POA: Insufficient documentation

## 2022-03-18 DIAGNOSIS — M5412 Radiculopathy, cervical region: Secondary | ICD-10-CM

## 2022-03-18 HISTORY — DX: Peripheral vascular disease, unspecified: I73.9

## 2022-03-18 LAB — CBC WITH DIFFERENTIAL/PLATELET
Abs Immature Granulocytes: 0.02 10*3/uL (ref 0.00–0.07)
Basophils Absolute: 0 10*3/uL (ref 0.0–0.1)
Basophils Relative: 1 %
Eosinophils Absolute: 0.3 10*3/uL (ref 0.0–0.5)
Eosinophils Relative: 7 %
HCT: 45.5 % (ref 39.0–52.0)
Hemoglobin: 14 g/dL (ref 13.0–17.0)
Immature Granulocytes: 1 %
Lymphocytes Relative: 38 %
Lymphs Abs: 1.6 10*3/uL (ref 0.7–4.0)
MCH: 28 pg (ref 26.0–34.0)
MCHC: 30.8 g/dL (ref 30.0–36.0)
MCV: 91 fL (ref 80.0–100.0)
Monocytes Absolute: 0.5 10*3/uL (ref 0.1–1.0)
Monocytes Relative: 12 %
Neutro Abs: 1.7 10*3/uL (ref 1.7–7.7)
Neutrophils Relative %: 41 %
Platelets: 243 10*3/uL (ref 150–400)
RBC: 5 MIL/uL (ref 4.22–5.81)
RDW: 12.5 % (ref 11.5–15.5)
WBC: 4.1 10*3/uL (ref 4.0–10.5)
nRBC: 0 % (ref 0.0–0.2)

## 2022-03-18 LAB — COMPREHENSIVE METABOLIC PANEL
ALT: 21 U/L (ref 0–44)
AST: 23 U/L (ref 15–41)
Albumin: 4.3 g/dL (ref 3.5–5.0)
Alkaline Phosphatase: 49 U/L (ref 38–126)
Anion gap: 7 (ref 5–15)
BUN: 23 mg/dL — ABNORMAL HIGH (ref 6–20)
CO2: 25 mmol/L (ref 22–32)
Calcium: 9.8 mg/dL (ref 8.9–10.3)
Chloride: 103 mmol/L (ref 98–111)
Creatinine, Ser: 1.05 mg/dL (ref 0.61–1.24)
GFR, Estimated: >60 mL/min (ref >60)
Glucose, Bld: 131 mg/dL — ABNORMAL HIGH (ref 70–99)
Potassium: 4 mmol/L (ref 3.5–5.1)
Sodium: 135 mmol/L (ref 135–145)
Total Bilirubin: 0.9 mg/dL (ref 0.3–1.2)
Total Protein: 7.5 g/dL (ref 6.5–8.1)

## 2022-03-18 LAB — SEDIMENTATION RATE: Sed Rate: 1 mm/hr (ref 0–20)

## 2022-03-18 MED ORDER — ACETAMINOPHEN 500 MG PO TABS
ORAL_TABLET | ORAL | Status: AC
  Filled 2022-03-18: qty 2

## 2022-03-18 MED ORDER — IOHEXOL 350 MG/ML SOLN
75.0000 mL | Freq: Once | INTRAVENOUS | Status: AC | PRN
  Administered 2022-03-18: 75 mL via INTRAVENOUS

## 2022-03-18 MED ORDER — ACETAMINOPHEN 325 MG PO TABS
650.0000 mg | ORAL_TABLET | Freq: Once | ORAL | Status: AC
  Administered 2022-03-18: 650 mg via ORAL
  Filled 2022-03-18: qty 2

## 2022-03-18 MED ORDER — ACETAMINOPHEN 500 MG PO TABS
1000.0000 mg | ORAL_TABLET | Freq: Once | ORAL | Status: AC
  Administered 2022-03-18: 1000 mg via ORAL

## 2022-03-18 NOTE — ED Notes (Signed)
Pt given snack and drink per provider Malinda's order via secure chat.

## 2022-03-18 NOTE — ED Notes (Signed)
Patient returned from MRI at this time 4:58 PM

## 2022-03-18 NOTE — ED Notes (Signed)
Pt at MRI; will complete visual acuity once pt back.

## 2022-03-18 NOTE — ED Notes (Signed)
Pt has visitor at bedside.

## 2022-03-18 NOTE — Discharge Instructions (Addendum)
Please follow-up with the eye doctor, Dr. George Ina.  He said he would see you tomorrow at 10 AM at [facility] on [address].  He said he wanted you to go to the right side door.  He said that the front door will be locked but the right side should be open with the sensing "after hours."  You should also follow-up with the neurosurgeon in the next 1 to 2 weeks.  A referral number has been provided.  Your MRI showed a possible tiny area of bleeding in the left side of the brain although this is not showing up on your CT scans.  Your neck MRI showed an area of pinched nerves on the left side which explains the numbness in the left arm.  Return to the ER immediately for new, worsening, or persistent severe headache, dizziness, vomiting, worsening vision changes, weakness in any of your arms or legs, difficulty walking or with balance, or any other new or worsening symptoms that concern you.

## 2022-03-18 NOTE — ED Provider Notes (Signed)
[facility] Provider Note    Event Date/Time   First MD Initiated Contact with Patient 03/18/22 1156     (approximate)   History   Eye Problem (right)   HPI  Ricky King is a 56 y.o. male who was sent here from his medical clinic at [facility].  He reportedly he has had right blurry vision since Monday 5 days ago and left arm numbness for least 2 weeks.  Patient had blurry vision about 4 months ago as well.  He had a right-sided stent placed in his carotid here earlier.      Physical Exam   Triage Vital Signs: ED Triage Vitals  Enc Vitals Group     BP 03/18/22 1030 (!) 153/74     Pulse Rate 03/18/22 1030 70     Resp 03/18/22 1030 18     Temp 03/18/22 1030 97.7 F (36.5 C)     Temp Source 03/18/22 1030 Oral     SpO2 03/18/22 1030 94 %     Weight 03/18/22 1041 199 lb (90.3 kg)     Height 03/18/22 1041 5' 7.5" (1.715 m)     Head Circumference --      Peak Flow --      Pain Score 03/18/22 1041 8     Pain Loc --      Pain Edu? --      Excl. in [place]? --     Most recent vital signs: Vitals:   03/18/22 1415 03/18/22 1547  BP: (!) 154/91 (!) 148/88  Pulse: 61 60  Resp: 19 17  Temp:    SpO2: 99% 100%     General: Awake, no distress.  Head normocephalic atraumatic eyes pupils equal round react light CV:  Good peripheral perfusion.  Heart regular rate and rhythm no audible murmur Resp:  Normal effort.  Lungs are clear Abd:  No distention.  Soft and nontender Extremities with no edema Cranial nerves II through XII are intact except for that there is blurry vision in his right eye.  It does not seem to be in the right visual field.   ED Results / Procedures / Treatments   Labs (all labs ordered are listed, but only abnormal results are displayed) Labs Reviewed  COMPREHENSIVE METABOLIC PANEL - Abnormal; Notable for the following components:      Result Value   Glucose, Bld 131 (*)    BUN 23 (*)    All other components within normal  limits  SEDIMENTATION RATE  CBC WITH DIFFERENTIAL/PLATELET     EKG     RADIOLOGY CT head shows a chronic left caudate lacunar infarct.  CT angio shows only the known obstructions.   PROCEDURES:  Critical Care performed:   Procedures   MEDICATIONS ORDERED IN ED: Medications  acetaminophen (TYLENOL) 500 MG tablet (has no administration in time range)  acetaminophen (TYLENOL) tablet 1,000 mg (1,000 mg Oral Given 03/18/22 1327)  iohexol (OMNIPAQUE) 350 MG/ML injection 75 mL (75 mLs Intravenous Contrast Given 03/18/22 1436)     IMPRESSION / MDM / ASSESSMENT AND PLAN / ED COURSE  I reviewed the triage vital signs and the nursing notes. Patient's vision on rechecking seems to be mostly only in the right eye there is no right visual field cut as near as I can tell.  Assuming the MRI will be negative I will have him follow-up with the eye doctor. I did discuss this with Dr. George Ina who is on-call.  Dr.  Porfilio so he can see him at 10 AM tomorrow at [facility]   Patient's presentation is most consistent with acute presentation with potential threat to life or bodily function.   I will sign the patient out to oncoming provider to check on the MRI report.  If that is negative we will follow the plan as noted above.   FINAL CLINICAL IMPRESSION(S) / ED DIAGNOSES   Final diagnoses:  Blurry vision, right eye     Rx / DC Orders   ED Discharge Orders     None        Note:  This document was prepared using Dragon voice recognition software and may include unintentional dictation errors.   Nena Polio, MD 03/18/22 [phone]

## 2022-03-18 NOTE — ED Provider Notes (Addendum)
-----------------------------------------   5:43 PM on 03/18/2022 -----------------------------------------  I took over care of this patient from Dr. Cinda Quest.  MRI shows a small area of subarachnoid hemorrhage in the left central sulcus, which is not consistent with the patient's numbness which is on the left side also.  It is also not showing up on the CT and CT angio of the patient had earlier this afternoon.  MRI of the cervical spine shows spinal canal stenosis and likely radiculopathy on the left which does correspond to the patient's arm numbness.  He has no weakness  On reassessment, the patient appears comfortable and has not developed any headache or new symptoms.  I consulted Dr. Tamala Julian from neurosurgery.  He will review the images but recommends we perform a repeat CT on the patient in about 4 hours and then reassess.  ----------------------------------------- 10:06 PM on 03/18/2022 -----------------------------------------  CT head is unchanged from prior and shows no area of bleeding corresponding to the abnormality found on the MRI.    IMPRESSION:  1. The MRI demonstrated trace hemorrhage at the left cranial vertex  is not well seen by CT.  2. Stable CT appearance of the brain since examination performed  earlier today    I once again discussed the case with Dr. Tamala Julian from neurosurgery who has reviewed the images.  He advises that the tiny area of possible bleeding on the MRI is not of clinical significance given that the patient has no symptoms and no CT findings.  He does not recommend any further work-up and advises that the patient is appropriate for discharge home with outpatient follow-up.  He does not recommend holding or discontinuing the patient's aspirin Plavix we also discussed the results of the cervical spine MRI.  The patient has no motor deficits on exam.  Dr. Tamala Julian also recommends outpatient follow-up for this.  I also discussed the case with Dr. Quinn Axe from  neurology who confirms that if the patient is cleared from a neurosurgical perspective she does not have any additional recommendations.  The patient has been referred to Dr. George Ina from ophthalmology for a 10 AM appointment tomorrow.  I had an extensive discussion with the patient about the results of the imaging and the recommendations of the specialist.  I answered all of his questions.  He is comfortable with the plan.  I gave him very thorough return precautions and he expresses understanding    Arta Silence, MD 03/18/22 2209    Arta Silence, MD 03/18/22 2215

## 2022-03-18 NOTE — ED Notes (Signed)
Pt alert and resting calmly on stretcher.

## 2022-03-18 NOTE — ED Notes (Signed)
Pt eating dinner. Pt denies HA.

## 2022-03-18 NOTE — ED Notes (Signed)
Visual: Left eye 20/30 and R 20/40.

## 2022-03-18 NOTE — ED Triage Notes (Signed)
Patient reports he is experiencing blurry vision in right eye, right side. Reports same occurrence happened about 4 months ago and resolved and within the last week has became blurry again in peripheral. Reports hx right sided neck surgery at [facility].

## 2022-03-18 NOTE — ED Notes (Signed)
Pt given hospital phone to talk with his family. Pt was steady walking to visual acuity board.

## 2022-03-18 NOTE — ED Notes (Signed)
Called dietary to order food tray per Malinda's verbal order; if pt's HA not better after food provider states to give pt '600mg'$  of ibuprofen.

## 2022-03-18 NOTE — ED Notes (Signed)
EKG to EDP Siadecki in person.  

## 2022-03-18 NOTE — ED Notes (Signed)
Provider [facility] notified via secure chat that pt requesting something else for his HA. Awaiting reply.

## 2022-03-18 NOTE — ED Notes (Signed)
Pt attempted to sign for d/c paperwork; topaz frozen.

## 2022-03-18 NOTE — ED Notes (Signed)
Pt denies any needs currently.  

## 2022-03-18 NOTE — ED Notes (Signed)
Pt denies any needs; pt resting calmly on stretcher. VSS.

## 2022-06-14 IMAGING — CT CT HEAD W/O CM
4 series · 16 of 47 positions shown, 18 images · non-contrast
Comparison: None Available.

CLINICAL DATA: Neuro deficit, acute, stroke suspected. Blurry
vision right eye for 5 days. Left arm numbness for 2 months.



[Series 2: head wo · axial · 0.41mm/px · z∈[-97,+18]mm · 7 of 31 slices shown, 9 images]
[im 4/31  brain]
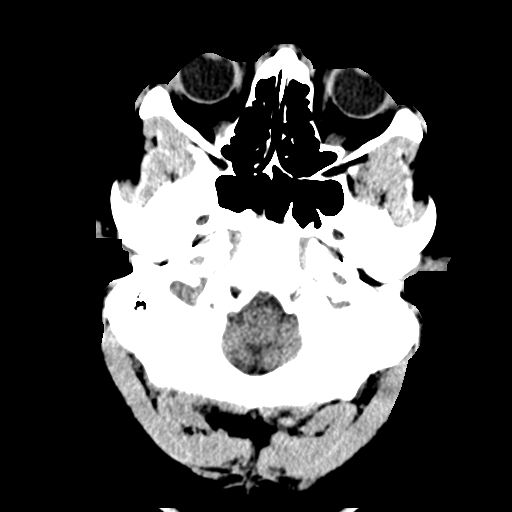
[im 4/31  bone]
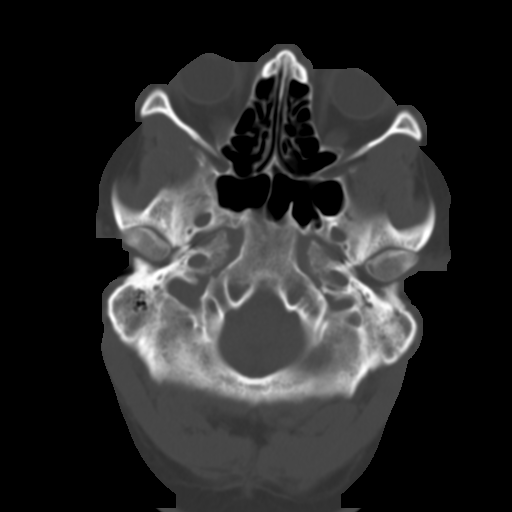
[im 8/31  brain]
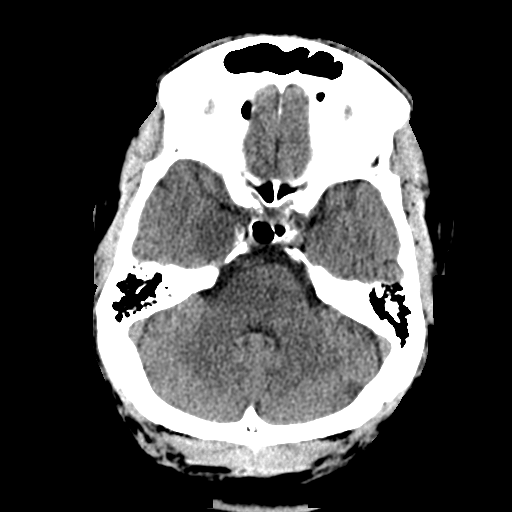
[im 12/31  brain]
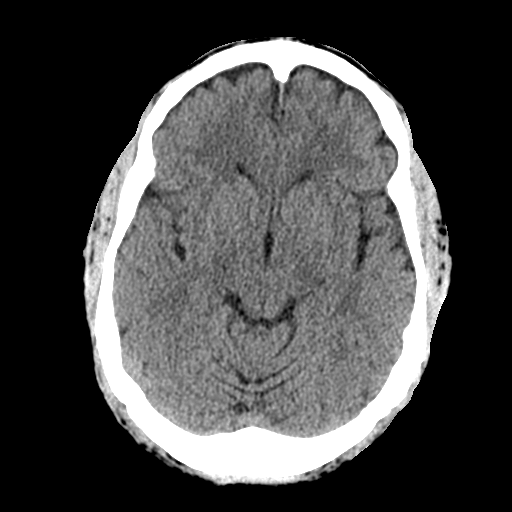
[im 16/31  brain]
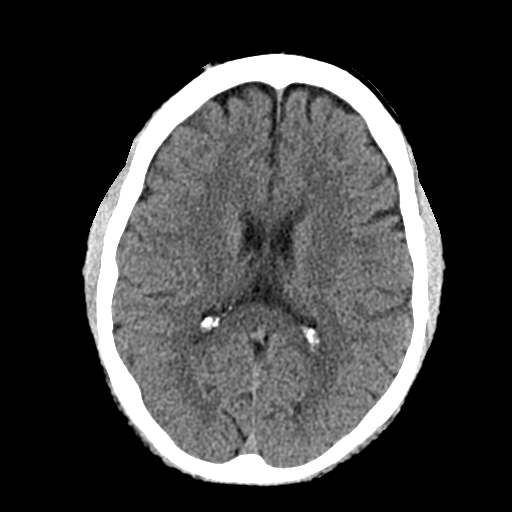
[im 19/31  brain]
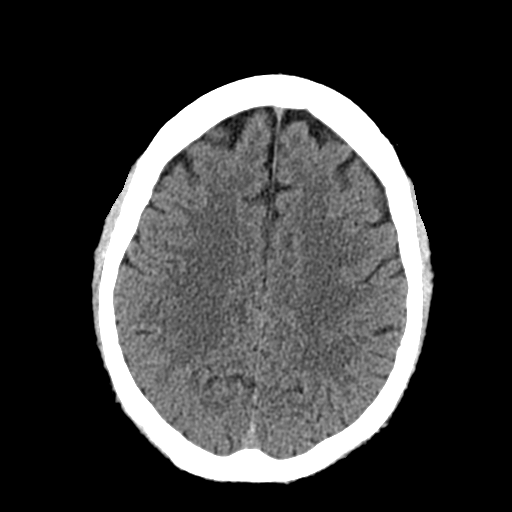
[im 19/31  bone]
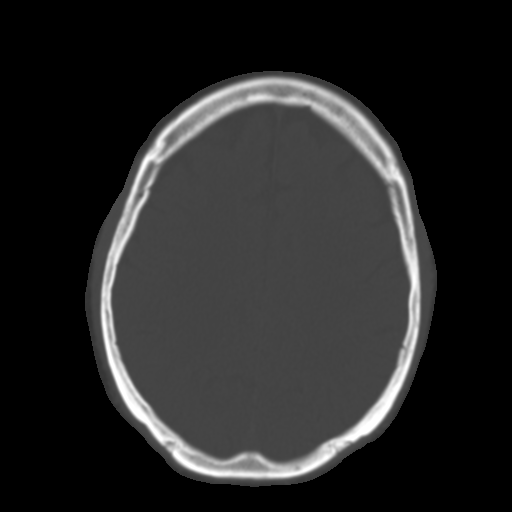
[im 23/31  brain]
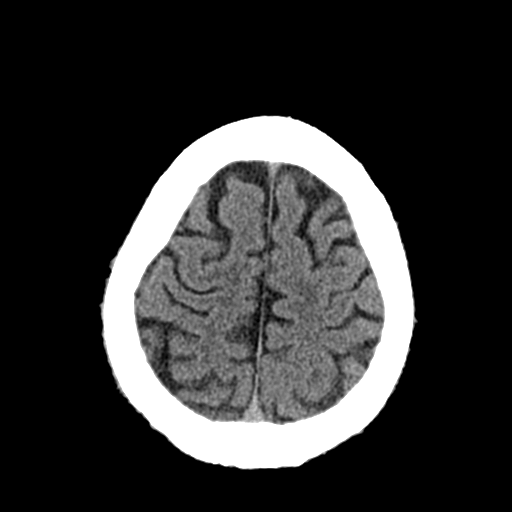
[im 27/31  brain]
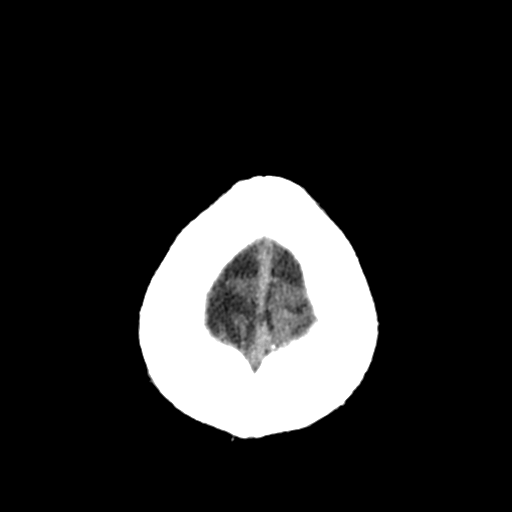

[Series 3: head bone · axial · 0.41mm/px · z∈[-98,-66]mm · 3 of 78 slices shown]
[im 8/78  bone]
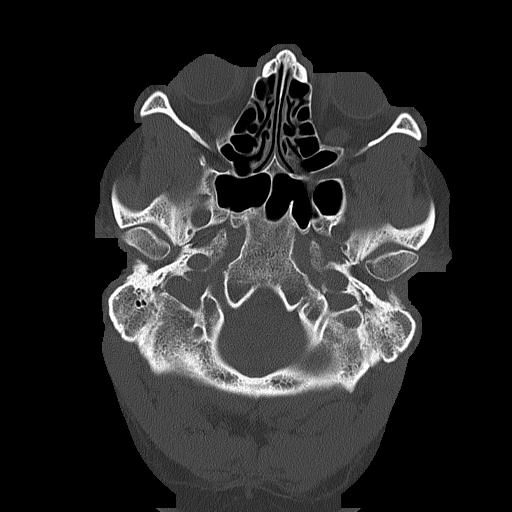
[im 16/78  bone]
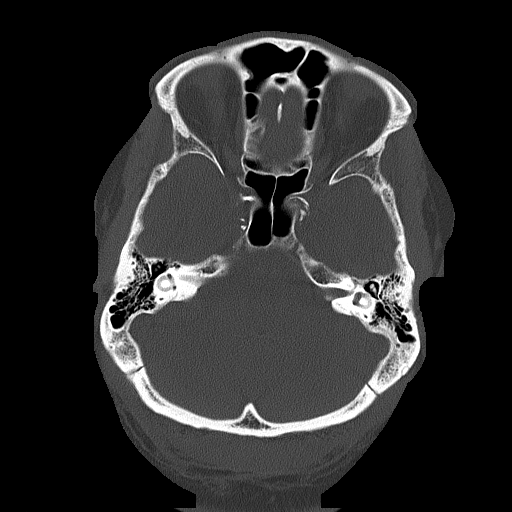
[im 24/78  bone]
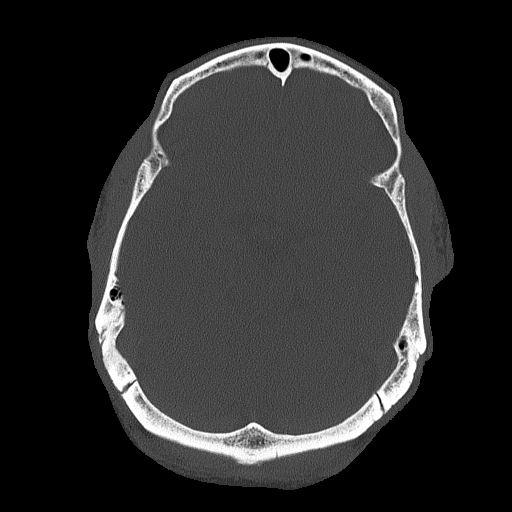

[Series 4: coronal soft tissue · coronal · 0.33mm/px · 3 of 67 slices shown]
[im 23/67  brain]
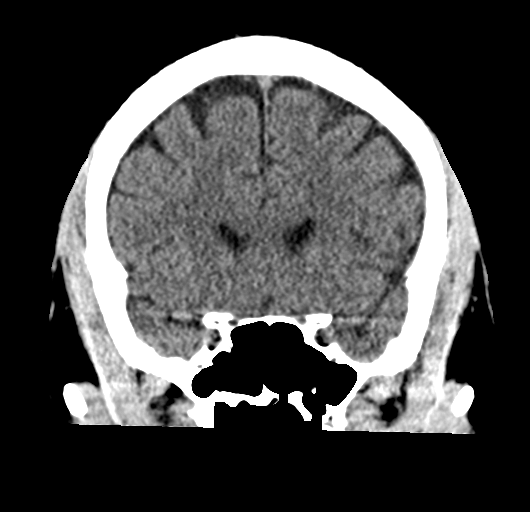
[im 30/67  brain]
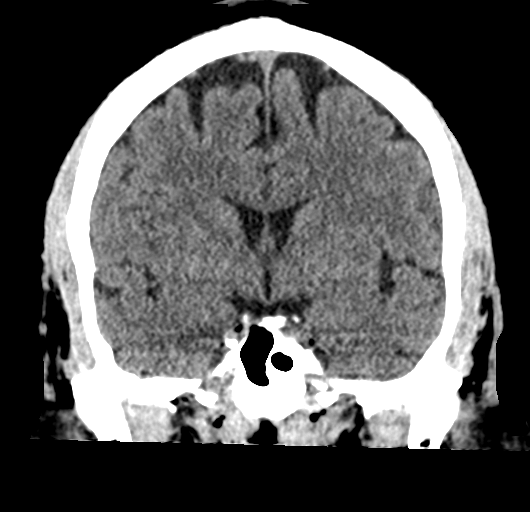
[im 37/67  brain]
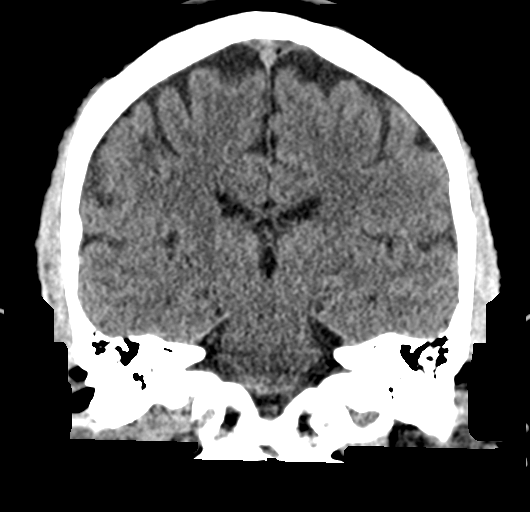

[Series 5: sagittal soft tissue · sagittal · 0.33mm/px · 3 of 58 slices shown]
[im 20/58  brain]
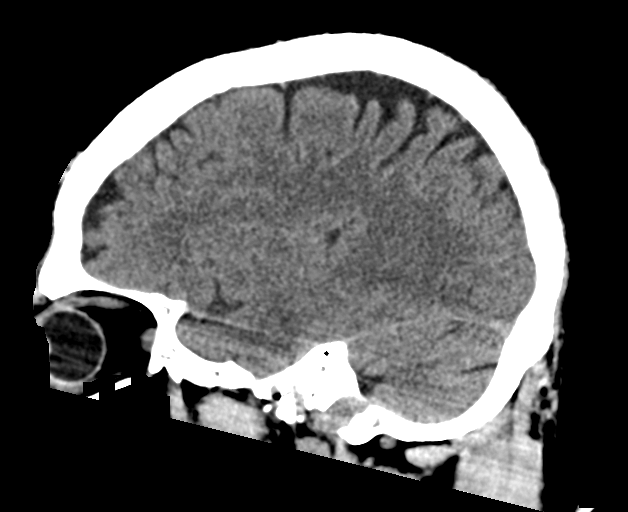
[im 29/58  brain]
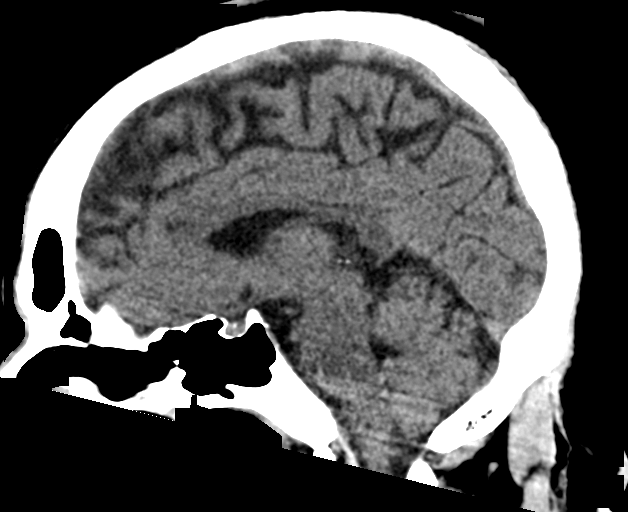
[im 39/58  brain]
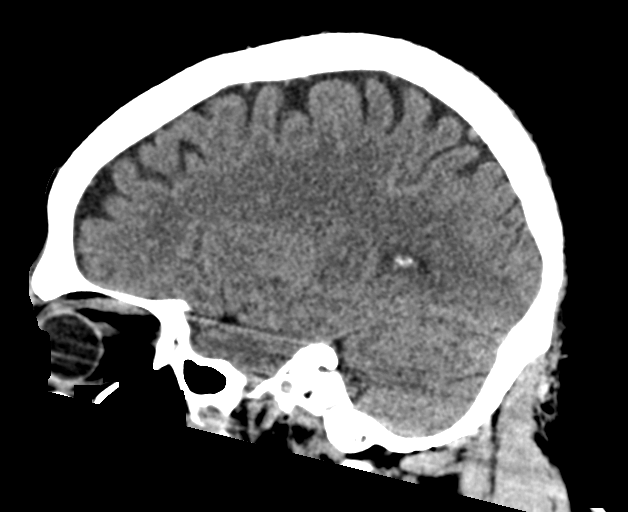

[16 of 47 positions shown; findings below may reference images not displayed]

FINDINGS: Brain: There is no evidence of an acute infarct, intracranial
hemorrhage, mass, midline shift, or extra-axial fluid collection.
The ventricles and sulci are normal. A lacunar infarct in the right
caudate head is chronic in appearance.

Vascular: Calcified atherosclerosis at the skull base. No hyperdense
vessel.

Skull: No fracture or suspicious osseous lesion.

Sinuses/Orbits: Visualized paranasal sinuses and mastoid air cells
are clear. Unremarkable orbits.

Other: None.
IMPRESSION: 1. No evidence of acute intracranial abnormality.
2. Chronic right caudate lacunar infarct.

## 2022-07-20 ENCOUNTER — Other Ambulatory Visit (INDEPENDENT_AMBULATORY_CARE_PROVIDER_SITE_OTHER): Payer: Self-pay | Admitting: Nurse Practitioner

## 2022-07-20 DIAGNOSIS — I739 Peripheral vascular disease, unspecified: Secondary | ICD-10-CM

## 2022-07-20 DIAGNOSIS — I6523 Occlusion and stenosis of bilateral carotid arteries: Secondary | ICD-10-CM

## 2022-07-22 ENCOUNTER — Ambulatory Visit (INDEPENDENT_AMBULATORY_CARE_PROVIDER_SITE_OTHER): Payer: Medicaid Other | Admitting: Vascular Surgery

## 2022-07-22 ENCOUNTER — Encounter (INDEPENDENT_AMBULATORY_CARE_PROVIDER_SITE_OTHER): Payer: Medicaid Other

## 2022-08-19 ENCOUNTER — Encounter (INDEPENDENT_AMBULATORY_CARE_PROVIDER_SITE_OTHER): Payer: Medicaid Other | Admitting: Vascular Surgery

## 2022-08-19 ENCOUNTER — Encounter (INDEPENDENT_AMBULATORY_CARE_PROVIDER_SITE_OTHER): Payer: Medicaid Other

## 2023-05-11 ENCOUNTER — Encounter (INDEPENDENT_AMBULATORY_CARE_PROVIDER_SITE_OTHER): Payer: Medicaid Other

## 2023-05-11 ENCOUNTER — Ambulatory Visit (INDEPENDENT_AMBULATORY_CARE_PROVIDER_SITE_OTHER): Payer: Medicaid Other | Admitting: Nurse Practitioner

## 2024-01-09 ENCOUNTER — Other Ambulatory Visit (INDEPENDENT_AMBULATORY_CARE_PROVIDER_SITE_OTHER): Payer: Self-pay | Admitting: Nurse Practitioner

## 2024-01-09 DIAGNOSIS — I739 Peripheral vascular disease, unspecified: Secondary | ICD-10-CM

## 2024-01-09 DIAGNOSIS — I779 Disorder of arteries and arterioles, unspecified: Secondary | ICD-10-CM

## 2024-01-10 ENCOUNTER — Encounter (INDEPENDENT_AMBULATORY_CARE_PROVIDER_SITE_OTHER): Payer: Medicaid Other

## 2024-01-10 ENCOUNTER — Ambulatory Visit (INDEPENDENT_AMBULATORY_CARE_PROVIDER_SITE_OTHER): Payer: Medicaid Other | Admitting: Nurse Practitioner

## 2024-11-20 ENCOUNTER — Other Ambulatory Visit: Payer: Self-pay

## 2024-11-29 ENCOUNTER — Other Ambulatory Visit: Payer: Self-pay

## 2024-11-29 ENCOUNTER — Emergency Department

## 2024-11-29 ENCOUNTER — Emergency Department
Admission: EM | Admit: 2024-11-29 | Discharge: 2024-11-29 | Disposition: A | Discharge status: Home / Self Care | Source: Home / Self Care | Attending: Emergency Medicine | Admitting: Emergency Medicine

## 2024-11-29 DIAGNOSIS — R519 Headache, unspecified: Secondary | ICD-10-CM

## 2024-11-29 DIAGNOSIS — R0789 Other chest pain: Secondary | ICD-10-CM

## 2024-11-29 DIAGNOSIS — R070 Pain in throat: Secondary | ICD-10-CM

## 2024-11-29 LAB — CBC
HCT: 43.5 % (ref 39.0–52.0)
Hemoglobin: 13.8 g/dL (ref 13.0–17.0)
MCH: 29.1 pg (ref 26.0–34.0)
MCHC: 31.7 g/dL (ref 30.0–36.0)
MCV: 91.8 fL (ref 80.0–100.0)
Platelets: 291 10*3/uL (ref 150–400)
RBC: 4.74 MIL/uL (ref 4.22–5.81)
RDW: 12.2 % (ref 11.5–15.5)
WBC: 5 10*3/uL (ref 4.0–10.5)
nRBC: 0 % (ref 0.0–0.2)

## 2024-11-29 LAB — BASIC METABOLIC PANEL WITH GFR
Anion gap: 10 (ref 5–15)
BUN: 16 mg/dL (ref 6–20)
CO2: 26 mmol/L (ref 22–32)
Calcium: 9.6 mg/dL (ref 8.9–10.3)
Chloride: 101 mmol/L (ref 98–111)
Creatinine, Ser: 1.02 mg/dL (ref 0.61–1.24)
GFR, Estimated: >60 mL/min
Glucose, Bld: 121 mg/dL — ABNORMAL HIGH (ref 70–99)
Potassium: 4.2 mmol/L (ref 3.5–5.1)
Sodium: 137 mmol/L (ref 135–145)

## 2024-11-29 LAB — GROUP A STREP BY PCR: Group A Strep by PCR: NOT DETECTED

## 2024-11-29 LAB — TROPONIN T, HIGH SENSITIVITY: Troponin T High Sensitivity: 16 ng/L (ref 0–19)

## 2024-11-29 LAB — RESP PANEL BY RT-PCR (RSV, FLU A&B, COVID)  RVPGX2
Influenza A by PCR: NEGATIVE
Influenza B by PCR: NEGATIVE
Resp Syncytial Virus by PCR: NEGATIVE
SARS Coronavirus 2 by RT PCR: NEGATIVE

## 2024-11-29 MED ORDER — FAMOTIDINE 20 MG PO TABS
20.0000 mg | ORAL_TABLET | Freq: Once | ORAL | Status: AC
  Administered 2024-11-29: 20 mg via ORAL
  Filled 2024-11-29: qty 1

## 2024-11-29 MED ORDER — METOCLOPRAMIDE HCL 10 MG PO TABS
10.0000 mg | ORAL_TABLET | Freq: Once | ORAL | Status: AC
  Administered 2024-11-29: 10 mg via ORAL
  Filled 2024-11-29: qty 1

## 2024-11-29 MED ORDER — METOCLOPRAMIDE HCL 10 MG PO TABS: 10.0000 mg | ORAL_TABLET | Freq: Three times a day (TID) | ORAL | 0 refills | Status: AC | PRN

## 2024-11-29 MED ORDER — LIDOCAINE VISCOUS HCL 2 % MT SOLN
15.0000 mL | Freq: Once | OROMUCOSAL | Status: AC
  Administered 2024-11-29: 15 mL via OROMUCOSAL
  Filled 2024-11-29: qty 15

## 2024-11-29 MED ORDER — FAMOTIDINE 20 MG PO TABS: 20.0000 mg | ORAL_TABLET | Freq: Two times a day (BID) | ORAL | 0 refills | Status: AC

## 2024-11-29 NOTE — ED Triage Notes (Signed)
 C/o head, chest pain and throat x1 week. R sided CP burning sensation. PMH: PAD, HTN. Denies dizziness or SOB, no fevers. GCS 15. Ambulatory

## 2024-11-29 NOTE — ED Provider Notes (Signed)
 "  [facility] Provider Note    Event Date/Time   First MD Initiated Contact with Patient 11/29/24 1709     (approximate)   History   Chest Pain and Sore Throat   HPI  Ricky King is a 59 y.o. male with a history of hyperlipidemia, hypertension, hepatitis C, and GERD who presents with throat pain, chest pain, and headache for approximately the last week.  The patient states that the throat pain started first.  He describes the throat and chest pain as burning.  They have been mostly constant although somewhat worse at night.  He reports some mild shortness of breath.  Denies feeling dizzy or lightheaded.  He has no fever or chills.  He also reports a dull headache.  He denies any leg pain or swelling.  I reviewed the past medical records.  The patient was admitted to the hospitalist service in 2022 with carotid stenosis.  His most recent encounter in our system was in the ED in 2023 for blurred vision.   Physical Exam   Triage Vital Signs: ED Triage Vitals  Encounter Vitals Group     BP 11/29/24 1459 (!) 145/87     Girls Systolic BP Percentile --      Girls Diastolic BP Percentile --      Boys Systolic BP Percentile --      Boys Diastolic BP Percentile --      Pulse Rate 11/29/24 1458 (!) 58     Resp 11/29/24 1458 18     Temp 11/29/24 1459 98.5 F (36.9 C)     Temp Source 11/29/24 1459 Oral     SpO2 11/29/24 1459 95 %     Weight 11/29/24 1502 192 lb (87.1 kg)     Height 11/29/24 1502 5' 7 (1.702 m)     Head Circumference --      Peak Flow --      Pain Score --      Pain Loc --      Pain Education --      Exclude from Growth Chart --     Most recent vital signs: Vitals:   11/29/24 1458 11/29/24 1459  BP:  (!) 145/87  Pulse: (!) 58   Resp: 18 18  Temp:  98.5 F (36.9 C)  SpO2:  95%     General: Alert, comfortable appearing, no distress.  CV:  Good peripheral perfusion.  Normal heart sounds. Resp:  Normal effort.  Lungs  CTAB. Abd:  No distention.  Other:  No peripheral edema.  Oropharynx clear with no erythema or exudate.  Possible mild right anterior cervical lymphadenopathy.   ED Results / Procedures / Treatments   Labs (all labs ordered are listed, but only abnormal results are displayed) Labs Reviewed  BASIC METABOLIC PANEL WITH GFR - Abnormal; Notable for the following components:      Result Value   Glucose, Bld 121 (*)    All other components within normal limits  GROUP A STREP BY PCR  RESP PANEL BY RT-PCR (RSV, FLU A&B, COVID)  RVPGX2  CBC  TROPONIN T, HIGH SENSITIVITY     EKG  ED ECG REPORT I, Waylon Cassis, the attending physician, personally viewed and interpreted this ECG.  Date: 11/29/2024 EKG Time: 1458 Rate: ST Rhythm: normal sinus rhythm QRS Axis: normal Intervals: normal ST/T Wave abnormalities: Nonspecific T wave abnormality Narrative Interpretation: no evidence of acute ischemia   RADIOLOGY  Chest x-ray: I independently viewed and  interpreted the images; there is no focal consolidation or edema.  Radiology notes an area of fullness in the right hilar region.  PROCEDURES:  Critical Care performed: No  Procedures   MEDICATIONS ORDERED IN ED: Medications  metoCLOPramide  (REGLAN ) tablet 10 mg (10 mg Oral Given 11/29/24 1802)  famotidine  (PEPCID ) tablet 20 mg (20 mg Oral Given 11/29/24 1802)  lidocaine  (XYLOCAINE ) 2 % viscous mouth solution 15 mL (15 mLs Mouth/Throat Given 11/29/24 1802)     IMPRESSION / MDM / ASSESSMENT AND PLAN / ED COURSE  I reviewed the triage vital signs and the nursing notes.  59 year old male with PMH as noted above presents with throat pain, atypical chest pain, and some headache over the last week.  On exam the patient is overall well-appearing.  Vital signs are normal.  Physical exam is otherwise unremarkable for acute findings.  EKG is nonischemic.  Chest x-ray is clear except for nonspecific right infrahilar  fullness.  Differential diagnosis includes, but is not limited to, strep pharyngitis, viral pharyngitis, other viral syndrome, GERD, musculoskeletal pain.  I have a low suspicion for ACS.  There is no clinical evidence for PE, or for aortic dissection or other vascular etiology.  Patient's presentation is most consistent with acute complicated illness / injury requiring diagnostic workup.  Troponin is negative.  Given the duration of the symptoms, there is no indication for serial troponins.  BMP and CBC are unremarkable.  Respiratory panel is negative.  Strep swab is negative.  Overall presentation is most consistent with GERD.  At this time, there is no indication for further ED workup.  The patient is comfortable and is agreeable with discharge home.  He is stable for discharge at this time.  I counseled him on the results of the workup and answered all of his questions.  I specifically counseled him on the x-ray finding and the need for follow-up with either repeat x-ray or CT through his primary care provider.  I have prescribed Pepcid  and Reglan  for symptomatic treatment.  I gave strict return precautions, and he expressed understanding.   FINAL CLINICAL IMPRESSION(S) / ED DIAGNOSES   Final diagnoses:  Atypical chest pain  Throat pain  Acute nonintractable headache, unspecified headache type     Rx / DC Orders   ED Discharge Orders          Ordered    famotidine  (PEPCID ) 20 MG tablet  2 times daily        11/29/24 1801    metoCLOPramide  (REGLAN ) 10 MG tablet  Every 8 hours PRN        11/29/24 1801             Note:  This document was prepared using Dragon voice recognition software and may include unintentional dictation errors.    Jacolyn Pae, MD 11/29/24 [id number]  "

## 2024-11-29 NOTE — Discharge Instructions (Signed)
 You likely have stomach acid coming up causing the throat and chest pain.  This could also be from a viral infection.  Your strep swab, flu and COVID test were all negative.  Your heart enzymes are normal.  Your x-ray shows an area of haziness in the right lung.  This does not look like pneumonia or anything acute.  He will need to be follow-up with either repeat x-ray or an outpatient CAT scan ordered by your primary care doctor in the next 1 to 2 months.  Starting the Pepcid  twice daily for the next 1 to 2 weeks.  You may take the Reglan  as needed for headache and/or nausea, in addition to Tylenol .  Some diet guidelines have been attached.  Follow-up with your primary care provider.  Return to the ER immediately for new, worsening, or persistent severe chest pain, throat pain, shortness of breath, dizziness or lightheadedness, difficulty swallowing, or any other new or worsening symptoms that concern you.

## 2024-11-29 NOTE — ED Triage Notes (Signed)
 First nurse note: pt to ED from [facility] for chest pain, h/a.
